# Patient Record
Sex: Male | Born: 1935 | Race: White | Hispanic: No | Marital: Married | Smoking: Former smoker
Health system: Southern US, Community
[De-identification: ages and names within clinical notes are randomized; demographics above are authoritative.]

## PROBLEM LIST (undated history)

## (undated) DIAGNOSIS — E119 Type 2 diabetes mellitus without complications: Secondary | ICD-10-CM

## (undated) DIAGNOSIS — N200 Calculus of kidney: Secondary | ICD-10-CM

## (undated) DIAGNOSIS — J189 Pneumonia, unspecified organism: Secondary | ICD-10-CM

## (undated) DIAGNOSIS — Z950 Presence of cardiac pacemaker: Secondary | ICD-10-CM

## (undated) DIAGNOSIS — I4891 Unspecified atrial fibrillation: Secondary | ICD-10-CM

## (undated) DIAGNOSIS — E785 Hyperlipidemia, unspecified: Secondary | ICD-10-CM

## (undated) DIAGNOSIS — I2510 Atherosclerotic heart disease of native coronary artery without angina pectoris: Secondary | ICD-10-CM

## (undated) HISTORY — DX: Hyperlipidemia, unspecified: E78.5

## (undated) HISTORY — PX: CATARACT EXTRACTION W/ INTRAOCULAR LENS  IMPLANT, BILATERAL: SHX1307

## (undated) HISTORY — PX: APPENDECTOMY: SHX54

## (undated) HISTORY — DX: Atherosclerotic heart disease of native coronary artery without angina pectoris: I25.10

## (undated) HISTORY — PX: INGUINAL HERNIA REPAIR: SUR1180

---

## 1996-10-16 HISTORY — PX: CORONARY ARTERY BYPASS GRAFT: SHX141

## 2010-01-26 ENCOUNTER — Ambulatory Visit: Payer: Self-pay | Admitting: Ophthalmology

## 2010-01-31 ENCOUNTER — Ambulatory Visit: Payer: Self-pay | Admitting: Cardiology

## 2010-02-07 ENCOUNTER — Ambulatory Visit: Payer: Self-pay | Admitting: Ophthalmology

## 2013-01-01 ENCOUNTER — Ambulatory Visit: Payer: Self-pay | Admitting: Cardiovascular Disease

## 2013-01-01 DIAGNOSIS — I4821 Permanent atrial fibrillation: Secondary | ICD-10-CM | POA: Insufficient documentation

## 2013-01-01 DIAGNOSIS — I4891 Unspecified atrial fibrillation: Secondary | ICD-10-CM

## 2013-01-01 DIAGNOSIS — Z7901 Long term (current) use of anticoagulants: Secondary | ICD-10-CM | POA: Insufficient documentation

## 2013-03-06 ENCOUNTER — Ambulatory Visit: Payer: Self-pay | Admitting: Pharmacist Clinician (PhC)/ Clinical Pharmacy Specialist

## 2013-03-07 ENCOUNTER — Ambulatory Visit (INDEPENDENT_AMBULATORY_CARE_PROVIDER_SITE_OTHER): Payer: Medicare Other | Admitting: Pharmacist Clinician (PhC)/ Clinical Pharmacy Specialist

## 2013-03-07 VITALS — BP 140/80 | HR 60

## 2013-03-07 DIAGNOSIS — I4891 Unspecified atrial fibrillation: Secondary | ICD-10-CM

## 2013-03-07 DIAGNOSIS — Z7901 Long term (current) use of anticoagulants: Secondary | ICD-10-CM

## 2013-04-23 ENCOUNTER — Ambulatory Visit (INDEPENDENT_AMBULATORY_CARE_PROVIDER_SITE_OTHER): Payer: Medicare Other | Admitting: Pharmacist Clinician (PhC)/ Clinical Pharmacy Specialist

## 2013-04-23 VITALS — BP 136/76 | HR 72

## 2013-04-23 DIAGNOSIS — Z7901 Long term (current) use of anticoagulants: Secondary | ICD-10-CM

## 2013-04-23 DIAGNOSIS — I4891 Unspecified atrial fibrillation: Secondary | ICD-10-CM

## 2013-04-23 LAB — POCT INR: INR: 2.2

## 2013-05-19 ENCOUNTER — Ambulatory Visit: Payer: Self-pay | Admitting: Ophthalmology

## 2013-05-19 LAB — PROTIME-INR
INR: 2.1
Prothrombin Time: 22.9 secs — ABNORMAL HIGH (ref 11.5–14.7)

## 2013-05-26 ENCOUNTER — Ambulatory Visit: Payer: Self-pay | Admitting: Ophthalmology

## 2013-06-04 ENCOUNTER — Ambulatory Visit
Payer: No Typology Code available for payment source | Admitting: Pharmacist Clinician (PhC)/ Clinical Pharmacy Specialist

## 2013-06-04 ENCOUNTER — Ambulatory Visit (INDEPENDENT_AMBULATORY_CARE_PROVIDER_SITE_OTHER): Payer: Medicare Other | Admitting: Pharmacist Clinician (PhC)/ Clinical Pharmacy Specialist

## 2013-06-04 VITALS — BP 110/60 | HR 56

## 2013-06-04 DIAGNOSIS — Z7901 Long term (current) use of anticoagulants: Secondary | ICD-10-CM

## 2013-06-04 DIAGNOSIS — I4891 Unspecified atrial fibrillation: Secondary | ICD-10-CM

## 2013-06-04 LAB — POCT INR: INR: 2.4

## 2013-07-16 ENCOUNTER — Ambulatory Visit (INDEPENDENT_AMBULATORY_CARE_PROVIDER_SITE_OTHER): Payer: Medicare Other | Admitting: Pharmacist Clinician (PhC)/ Clinical Pharmacy Specialist

## 2013-07-16 VITALS — BP 120/56 | HR 60

## 2013-07-16 DIAGNOSIS — Z7901 Long term (current) use of anticoagulants: Secondary | ICD-10-CM

## 2013-07-16 DIAGNOSIS — I4891 Unspecified atrial fibrillation: Secondary | ICD-10-CM

## 2013-07-16 LAB — POCT INR: INR: 2.2

## 2013-08-28 ENCOUNTER — Ambulatory Visit (INDEPENDENT_AMBULATORY_CARE_PROVIDER_SITE_OTHER): Payer: Medicare Other | Admitting: Pharmacist Clinician (PhC)/ Clinical Pharmacy Specialist

## 2013-08-28 VITALS — BP 124/72 | HR 52

## 2013-08-28 DIAGNOSIS — I4891 Unspecified atrial fibrillation: Secondary | ICD-10-CM

## 2013-08-28 DIAGNOSIS — Z7901 Long term (current) use of anticoagulants: Secondary | ICD-10-CM

## 2013-10-13 ENCOUNTER — Ambulatory Visit (INDEPENDENT_AMBULATORY_CARE_PROVIDER_SITE_OTHER): Payer: Medicare Other | Admitting: Pharmacist Clinician (PhC)/ Clinical Pharmacy Specialist

## 2013-10-13 VITALS — BP 146/64 | HR 60

## 2013-10-13 DIAGNOSIS — Z7901 Long term (current) use of anticoagulants: Secondary | ICD-10-CM

## 2013-10-13 DIAGNOSIS — I4891 Unspecified atrial fibrillation: Secondary | ICD-10-CM

## 2013-10-23 ENCOUNTER — Ambulatory Visit (INDEPENDENT_AMBULATORY_CARE_PROVIDER_SITE_OTHER): Payer: Medicare HMO | Admitting: Cardiovascular Disease

## 2013-10-23 ENCOUNTER — Encounter: Payer: Self-pay | Admitting: Cardiovascular Disease

## 2013-10-23 VITALS — BP 132/80 | HR 53 | Resp 20 | Ht 67.0 in | Wt 195.8 lb

## 2013-10-23 DIAGNOSIS — I2510 Atherosclerotic heart disease of native coronary artery without angina pectoris: Secondary | ICD-10-CM

## 2013-10-23 DIAGNOSIS — I4891 Unspecified atrial fibrillation: Secondary | ICD-10-CM

## 2013-10-23 DIAGNOSIS — E118 Type 2 diabetes mellitus with unspecified complications: Secondary | ICD-10-CM

## 2013-10-23 DIAGNOSIS — E785 Hyperlipidemia, unspecified: Secondary | ICD-10-CM

## 2013-10-23 DIAGNOSIS — I10 Essential (primary) hypertension: Secondary | ICD-10-CM

## 2013-10-23 NOTE — Patient Instructions (Signed)
Dr. Croitoru recommends that you schedule a follow-up appointment in: ONE YEAR   

## 2013-10-24 ENCOUNTER — Encounter: Payer: Self-pay | Admitting: Cardiovascular Disease

## 2013-10-24 DIAGNOSIS — I2510 Atherosclerotic heart disease of native coronary artery without angina pectoris: Secondary | ICD-10-CM | POA: Insufficient documentation

## 2013-10-24 DIAGNOSIS — I10 Essential (primary) hypertension: Secondary | ICD-10-CM | POA: Insufficient documentation

## 2013-10-24 DIAGNOSIS — E1129 Type 2 diabetes mellitus with other diabetic kidney complication: Secondary | ICD-10-CM | POA: Insufficient documentation

## 2013-10-24 DIAGNOSIS — E7800 Pure hypercholesterolemia, unspecified: Secondary | ICD-10-CM | POA: Insufficient documentation

## 2013-10-24 DIAGNOSIS — E118 Type 2 diabetes mellitus with unspecified complications: Secondary | ICD-10-CM | POA: Insufficient documentation

## 2013-10-24 NOTE — Assessment & Plan Note (Signed)
Ventricular rate is quite slow despite the absence of any AV node blocking agents. This suggests that he has fairly advanced AV node disease and it is possible that he will eventually require a permanent pacemaker. This is not necessary as long as he does not have symptoms of fatigue, exertional dyspnea or syncope. Continue lifelong anticoagulations with warfarin or equivalent drug.

## 2013-10-24 NOTE — Progress Notes (Signed)
Patient ID: Steve Lloyd, male   DOB: 19-Dec-1929, 80 y.o.   MRN: [id number]     Reason for office visit Followup CAD, atrial fibrillation  Mr. Delapena is a remarkably active and youthful appearing 80 year old man who returns in followup. He underwent coronary bypass surgery in 1998 (single-vessel LIMA to LAD) and has not had recurrent cardiac problems since that time. He has permanent atrial fibrillation with slow ventricular response and is on warfarin anticoagulation. He has not had any bleeding complications, stroke or other embolic events. He feels great. He plays golf 2-3 times a week and often does not use the golf cart. He denies also shortness of breath or chest pain.   No Known Allergies  Current Outpatient Prescriptions  Medication Sig Dispense Refill  . amLODipine (NORVASC) 5 MG tablet Take 5 mg by mouth daily.      Marland Kitchen aspirin 81 MG tablet Take 81 mg by mouth daily.      Marland Kitchen atorvastatin (LIPITOR) 10 MG tablet Take 10 mg by mouth daily.      . benazepril (LOTENSIN) 10 MG tablet Take 10 mg by mouth daily.      . Cyanocobalamin (VITAMIN B-12 IJ) Inject as directed every 30 (thirty) days.      . Multiple Vitamin (MULTIVITAMIN) tablet Take 1 tablet by mouth daily.      . pioglitazone-metformin (ACTOPLUS MET) 15-500 MG per tablet Take 1 tablet by mouth 2 (two) times daily with a meal.      . warfarin (COUMADIN) 5 MG tablet Take 5 mg by mouth daily. 71m daily except 2.572mM/W/F       No current facility-administered medications for this visit.    No past medical history on file.  No past surgical history on file.  No family history on file.  History   Social History  . Marital Status: Married    Spouse Name: N/A    Number of Children: N/A  . Years of Education: N/A   Occupational History  . Not on file.   Social History Main Topics  . Smoking status: Former Smoker    Quit date: 10/15/1956  . Smokeless tobacco: Not on file  . Alcohol Use: Yes     Comment: occas.  .  Drug Use: No  . Sexual Activity: Not on file   Other Topics Concern  . Not on file   Social History Narrative  . No narrative on file    Review of systems: The patient specifically denies any chest pain at rest or with exertion, dyspnea at rest or with exertion, orthopnea, paroxysmal nocturnal dyspnea, syncope, palpitations, focal neurological deficits, intermittent claudication, lower extremity edema, unexplained weight gain, cough, hemoptysis or wheezing.  The patient also denies abdominal pain, nausea, vomiting, dysphagia, diarrhea, constipation, polyuria, polydipsia, dysuria, hematuria, frequency, urgency, abnormal bleeding or bruising, fever, chills, unexpected weight changes, mood swings, change in skin or hair texture, change in voice quality, auditory or visual problems, allergic reactions or rashes, new musculoskeletal complaints other than usual "aches and pains".   PHYSICAL EXAM BP 132/80  Pulse 53  Resp 20  Ht _0  (1.702 m)  Wt 195 lb 12.8 oz (88.814 kg)  BMI 30.66 kg/m2  General: Alert, oriented x3, no distress Head: no evidence of trauma, PERRL, EOMI, no exophtalmos or lid lag, no myxedema, no xanthelasma; normal ears, nose and oropharynx Neck: normal jugular venous pulsations and no hepatojugular reflux; brisk carotid pulses without delay and no carotid bruits Chest: clear to auscultation, no  signs of consolidation by percussion or palpation, normal fremitus, symmetrical and full respiratory excursions, sternotomy scar Cardiovascular: normal position and quality of the apical impulse, irregular rhythm, normal first and second heart sounds, no murmurs, rubs or gallops Abdomen: no tenderness or distention, no masses by palpation, no abnormal pulsatility or arterial bruits, normal bowel sounds, no hepatosplenomegaly Extremities: no clubbing, cyanosis or edema; 2+ radial, ulnar and brachial pulses bilaterally; 2+ right femoral, posterior tibial and dorsalis pedis pulses; 2+  left femoral, posterior tibial and dorsalis pedis pulses; no subclavian or femoral bruits Neurological: grossly nonfocal   EKG: Atrial fibrillation with slow ventricular response and nonspecific ST-T segment changes  Lipid Panel  Total cholesterol 138, triglycerides 86, HDL 63, LDL 58 Hemoglobin A1c 6%  BMET No results found for this basename: na, k, cl, co2, glucose, bun, creatinine, calcium, gfrnonaa, gfraa     ASSESSMENT AND PLAN Atrial fibrillation, permanent Ventricular rate is quite slow despite the absence of any AV node blocking agents. This suggests that he has fairly advanced AV node disease and it is possible that he will eventually require a permanent pacemaker. This is not necessary as long as he does not have symptoms of fatigue, exertional dyspnea or syncope. Continue lifelong anticoagulations with warfarin or equivalent drug.  CAD s/p CABG LIMA to LAD 1998 He has had no symptoms of coronary disease since his bypass procedure more than 16 years ago. His coronary risk factors are generally well controlled. I have encouraged him to remain physically active as this should give [place] early warning of recurrent cardiac problems.  Essential hypertension Well-controlled  Diabetes mellitus type 2 with complications, controlled    Hyperlipidemia He has excellent lipid parameters on the current statin regimen   Patient Instructions  Dr. Sallyanne Kuster recommends that you schedule a follow-up appointment in: [place].     Orders Placed This Encounter  Procedures  . EKG 12-Lead   Meds ordered this encounter  Medications  . benazepril (LOTENSIN) 10 MG tablet    Sig: Take 10 mg by mouth daily.  Marland Kitchen amLODipine (NORVASC) 5 MG tablet    Sig: Take 5 mg by mouth daily.  Marland Kitchen atorvastatin (LIPITOR) 10 MG tablet    Sig: Take 10 mg by mouth daily.  Marland Kitchen warfarin (COUMADIN) 5 MG tablet    Sig: Take 5 mg by mouth daily. 26m daily except 2.532mM/W/F  . aspirin 81 MG tablet    Sig: Take 81  mg by mouth daily.  . Multiple Vitamin (MULTIVITAMIN) tablet    Sig: Take 1 tablet by mouth daily.  . pioglitazone-metformin (ACTOPLUS MET) 15-500 MG per tablet    Sig: Take 1 tablet by mouth 2 (two) times daily with a meal.  . Cyanocobalamin (VITAMIN B-12 IJ)    Sig: Inject as directed every 30 (thirty) days.    CRHolli HumblesMD, FALake [phone] ([phone]

## 2013-10-24 NOTE — Assessment & Plan Note (Signed)
Well controlled 

## 2013-10-24 NOTE — Assessment & Plan Note (Signed)
He has excellent lipid parameters on the current statin regimen

## 2013-10-24 NOTE — Assessment & Plan Note (Signed)
He has had no symptoms of coronary disease since his bypass procedure more than 16 years ago. His coronary risk factors are generally well controlled. I have encouraged him to remain physically active as this should give [place] early warning of recurrent cardiac problems.

## 2013-11-24 ENCOUNTER — Ambulatory Visit (INDEPENDENT_AMBULATORY_CARE_PROVIDER_SITE_OTHER): Payer: Medicare HMO | Admitting: Pharmacist Clinician (PhC)/ Clinical Pharmacy Specialist

## 2013-11-24 VITALS — BP 122/56 | HR 52

## 2013-11-24 DIAGNOSIS — Z7901 Long term (current) use of anticoagulants: Secondary | ICD-10-CM

## 2013-11-24 DIAGNOSIS — I4891 Unspecified atrial fibrillation: Secondary | ICD-10-CM

## 2013-11-24 LAB — POCT INR: INR: 2.5

## 2014-01-05 ENCOUNTER — Ambulatory Visit (INDEPENDENT_AMBULATORY_CARE_PROVIDER_SITE_OTHER): Payer: Medicare HMO | Admitting: Pharmacist Clinician (PhC)/ Clinical Pharmacy Specialist

## 2014-01-05 DIAGNOSIS — I4891 Unspecified atrial fibrillation: Secondary | ICD-10-CM

## 2014-01-05 DIAGNOSIS — Z7901 Long term (current) use of anticoagulants: Secondary | ICD-10-CM

## 2014-01-05 LAB — POCT INR: INR: 2.3

## 2014-02-12 ENCOUNTER — Other Ambulatory Visit: Payer: Self-pay

## 2014-02-12 MED ORDER — WARFARIN SODIUM 5 MG PO TABS: ORAL_TABLET | ORAL | Status: DC

## 2014-02-12 NOTE — Telephone Encounter (Signed)
Rx was sent to pharmacy electronically. 

## 2014-02-16 ENCOUNTER — Ambulatory Visit (INDEPENDENT_AMBULATORY_CARE_PROVIDER_SITE_OTHER): Payer: Medicare HMO | Admitting: Pharmacist Clinician (PhC)/ Clinical Pharmacy Specialist

## 2014-02-16 DIAGNOSIS — Z7901 Long term (current) use of anticoagulants: Secondary | ICD-10-CM

## 2014-02-16 DIAGNOSIS — I4891 Unspecified atrial fibrillation: Secondary | ICD-10-CM

## 2014-02-16 LAB — POCT INR: INR: 2.1

## 2014-03-13 ENCOUNTER — Telehealth: Payer: Self-pay | Admitting: Pharmacist Clinician (PhC)/ Clinical Pharmacy Specialist

## 2014-03-13 NOTE — Telephone Encounter (Signed)
Pt called, LMOM to return call  Returned call, pt stated was golfing on Tuesday, pulled muscle in arm with swing.  Next day arm was badly bruised, from shoulder down toward wrist.  He apparently applied heat within the first 24hrs.    Arm is deep purple bruise, all around and from shoulder to wrist.  Pt states can move fingers, wrist and elbow without pain or limitation, no tightness, numbness or tingling.  No pain.  Advised pt to continue to monitor and go to ER or UC if any problems such as pain, tightness, numbness or tingling develop.  Otherwise apply heat now for 5-10 minutes once to twice daily to help break up blood in tissue.    Pt agreed to plan and will seek help over weekend if needed.

## 2014-03-30 ENCOUNTER — Ambulatory Visit (INDEPENDENT_AMBULATORY_CARE_PROVIDER_SITE_OTHER): Payer: Commercial Managed Care - HMO | Admitting: Pharmacist Clinician (PhC)/ Clinical Pharmacy Specialist

## 2014-03-30 DIAGNOSIS — I4891 Unspecified atrial fibrillation: Secondary | ICD-10-CM | POA: Diagnosis not present

## 2014-03-30 DIAGNOSIS — Z7901 Long term (current) use of anticoagulants: Secondary | ICD-10-CM

## 2014-03-30 LAB — POCT INR: INR: 1.8

## 2014-05-11 ENCOUNTER — Ambulatory Visit (INDEPENDENT_AMBULATORY_CARE_PROVIDER_SITE_OTHER): Payer: Medicare HMO | Admitting: Pharmacist Clinician (PhC)/ Clinical Pharmacy Specialist

## 2014-05-11 DIAGNOSIS — I4891 Unspecified atrial fibrillation: Secondary | ICD-10-CM

## 2014-05-11 DIAGNOSIS — Z7901 Long term (current) use of anticoagulants: Secondary | ICD-10-CM

## 2014-05-11 LAB — POCT INR: INR: 2.4

## 2014-06-24 ENCOUNTER — Ambulatory Visit (INDEPENDENT_AMBULATORY_CARE_PROVIDER_SITE_OTHER): Payer: Medicare HMO | Admitting: Pharmacist Clinician (PhC)/ Clinical Pharmacy Specialist

## 2014-06-24 DIAGNOSIS — Z7901 Long term (current) use of anticoagulants: Secondary | ICD-10-CM | POA: Diagnosis not present

## 2014-06-24 DIAGNOSIS — I4891 Unspecified atrial fibrillation: Secondary | ICD-10-CM | POA: Diagnosis not present

## 2014-06-24 LAB — POCT INR: INR: 1.5

## 2014-07-08 ENCOUNTER — Ambulatory Visit (INDEPENDENT_AMBULATORY_CARE_PROVIDER_SITE_OTHER): Payer: Commercial Managed Care - HMO | Admitting: Pharmacist Clinician (PhC)/ Clinical Pharmacy Specialist

## 2014-07-08 DIAGNOSIS — I4891 Unspecified atrial fibrillation: Secondary | ICD-10-CM

## 2014-07-08 DIAGNOSIS — Z7901 Long term (current) use of anticoagulants: Secondary | ICD-10-CM

## 2014-07-08 LAB — POCT INR: INR: 2.8

## 2014-08-05 ENCOUNTER — Ambulatory Visit (INDEPENDENT_AMBULATORY_CARE_PROVIDER_SITE_OTHER): Payer: Commercial Managed Care - HMO | Admitting: Pharmacist Clinician (PhC)/ Clinical Pharmacy Specialist

## 2014-08-05 DIAGNOSIS — Z7901 Long term (current) use of anticoagulants: Secondary | ICD-10-CM

## 2014-08-05 DIAGNOSIS — I4891 Unspecified atrial fibrillation: Secondary | ICD-10-CM

## 2014-08-05 LAB — POCT INR
INR: 2.7
INR: 2.7

## 2014-09-16 ENCOUNTER — Ambulatory Visit (INDEPENDENT_AMBULATORY_CARE_PROVIDER_SITE_OTHER): Payer: Commercial Managed Care - HMO | Admitting: Pharmacist Clinician (PhC)/ Clinical Pharmacy Specialist

## 2014-09-16 DIAGNOSIS — I4891 Unspecified atrial fibrillation: Secondary | ICD-10-CM

## 2014-09-16 DIAGNOSIS — Z7901 Long term (current) use of anticoagulants: Secondary | ICD-10-CM

## 2014-09-16 LAB — POCT INR: INR: 3.6

## 2014-09-26 ENCOUNTER — Other Ambulatory Visit: Payer: Self-pay | Admitting: Pharmacist Clinician (PhC)/ Clinical Pharmacy Specialist

## 2014-10-07 ENCOUNTER — Ambulatory Visit (INDEPENDENT_AMBULATORY_CARE_PROVIDER_SITE_OTHER): Payer: Commercial Managed Care - HMO | Admitting: Pharmacist Clinician (PhC)/ Clinical Pharmacy Specialist

## 2014-10-07 DIAGNOSIS — I4891 Unspecified atrial fibrillation: Secondary | ICD-10-CM

## 2014-10-07 DIAGNOSIS — Z7901 Long term (current) use of anticoagulants: Secondary | ICD-10-CM

## 2014-10-07 LAB — POCT INR: INR: 3

## 2014-10-26 ENCOUNTER — Encounter: Payer: Self-pay | Admitting: Cardiovascular Disease

## 2014-10-26 ENCOUNTER — Ambulatory Visit (INDEPENDENT_AMBULATORY_CARE_PROVIDER_SITE_OTHER): Payer: Commercial Managed Care - HMO | Admitting: Pharmacist Clinician (PhC)/ Clinical Pharmacy Specialist

## 2014-10-26 ENCOUNTER — Ambulatory Visit (INDEPENDENT_AMBULATORY_CARE_PROVIDER_SITE_OTHER): Payer: Commercial Managed Care - HMO | Admitting: Cardiovascular Disease

## 2014-10-26 VITALS — BP 128/70 | HR 49 | Resp 16 | Ht 67.0 in | Wt 185.1 lb

## 2014-10-26 DIAGNOSIS — I4891 Unspecified atrial fibrillation: Secondary | ICD-10-CM

## 2014-10-26 DIAGNOSIS — I2510 Atherosclerotic heart disease of native coronary artery without angina pectoris: Secondary | ICD-10-CM

## 2014-10-26 DIAGNOSIS — Z7901 Long term (current) use of anticoagulants: Secondary | ICD-10-CM

## 2014-10-26 LAB — POCT INR: INR: 2.6

## 2014-10-26 NOTE — Patient Instructions (Signed)
Your physician wants you to follow-up in: 1 Year. You will receive a reminder letter in the mail two months in advance. If you don't receive a letter, please call our office to schedule the follow-up appointment.  

## 2014-10-27 ENCOUNTER — Encounter: Payer: Self-pay | Admitting: Cardiovascular Disease

## 2014-10-27 NOTE — Progress Notes (Signed)
Patient ID: Steve Lloyd, male   DOB: 1930-09-18, 81 y.o.   MRN: [id number]      Reason for office visit CAD, atrial fibrillation with slow ventricular response   Steve Lloyd remains a remarkably active and youthful appearing 81 year old man who returns in followup. He underwent coronary bypass surgery in 1998 (single-vessel LIMA to LAD) and has not had recurrent cardiac problems since that time. He has permanent atrial fibrillation with slow ventricular response and is on warfarin anticoagulation. He has not had any bleeding complications, stroke or other embolic events. He feels great. He plays golf 2-3 times a week and often does not use the golf cart. He denies also shortness of breath or chest pain.  He has lost 15 pounds since last year and has moved out of the "obese" category. He is now just overweight. He has diabetes mellitus on oral indications. He takes a statin for hyperlipidemia.  He denies any problems with dizziness, lightheadedness, syncope or fatigue despite a very slow heart rate. No Known Allergies  Current Outpatient Prescriptions  Medication Sig Dispense Refill  . amLODipine (NORVASC) 5 MG tablet Take 5 mg by mouth daily.    Marland Kitchen aspirin 81 MG tablet Take 81 mg by mouth daily.    Marland Kitchen atorvastatin (LIPITOR) 10 MG tablet Take 10 mg by mouth daily.    . benazepril (LOTENSIN) 10 MG tablet Take 10 mg by mouth daily.    . Cyanocobalamin (VITAMIN B-12 IJ) Inject 1 mg as directed every 30 (thirty) days.     . Multiple Vitamin (MULTIVITAMIN) tablet Take 1 tablet by mouth daily.    . pioglitazone-metformin (ACTOPLUS MET) 15-500 MG per tablet Take 1 tablet by mouth 2 (two) times daily with a meal.    . warfarin (COUMADIN) 5 MG tablet TAKE 1 TABLET BY MOUTH DAILY OR AS DIRECTED 90 tablet 1   No current facility-administered medications for this visit.    Past Medical History  Diagnosis Date  . Coronary artery disease   . Atrial fibrillation   . Hyperlipidemia   . Diabetes  mellitus without complication     Past Surgical History  Procedure Laterality Date  . Coronary artery bypass graft      History reviewed. No pertinent family history.  History   Social History  . Marital Status: Married    Spouse Name: N/A    Number of Children: N/A  . Years of Education: N/A   Occupational History  . Not on file.   Social History Main Topics  . Smoking status: Former Smoker    Quit date: 10/15/1956  . Smokeless tobacco: Not on file  . Alcohol Use: Yes     Comment: occas.  . Drug Use: No  . Sexual Activity: Not on file   Other Topics Concern  . Not on file   Social History Narrative    Review of systems: The patient specifically denies any chest pain at rest or with exertion, dyspnea at rest or with exertion, orthopnea, paroxysmal nocturnal dyspnea, syncope, palpitations, focal neurological deficits, intermittent claudication, lower extremity edema, unexplained weight gain, cough, hemoptysis or wheezing.  The patient also denies abdominal pain, nausea, vomiting, dysphagia, diarrhea, constipation, polyuria, polydipsia, dysuria, hematuria, frequency, urgency, abnormal bleeding or bruising, fever, chills, unexpected weight changes, mood swings, change in skin or hair texture, change in voice quality, auditory or visual problems, allergic reactions or rashes, new musculoskeletal complaints other than usual "aches and pains".   PHYSICAL EXAM BP 128/70 mmHg  Pulse  49  Resp 16  Ht 5' 7"  (1.702 m)  Wt 185 lb 1.6 oz (83.961 kg)  BMI 28.98 kg/m2  General: Alert, oriented x3, no distress Head: no evidence of trauma, PERRL, EOMI, no exophtalmos or lid lag, no myxedema, no xanthelasma; normal ears, nose and oropharynx Neck: normal jugular venous pulsations and no hepatojugular reflux; brisk carotid pulses without delay and no carotid bruits Chest: clear to auscultation, no signs of consolidation by percussion or palpation, normal fremitus, symmetrical and full  respiratory excursions Cardiovascular: normal position and quality of the apical impulse, slow irregular rhythm, normal first and second heart sounds, no murmurs, rubs or gallops Abdomen: no tenderness or distention, no masses by palpation, no abnormal pulsatility or arterial bruits, normal bowel sounds, no hepatosplenomegaly Extremities: no clubbing, cyanosis or edema; 2+ radial, ulnar and brachial pulses bilaterally; 2+ right femoral, posterior tibial and dorsalis pedis pulses; 2+ left femoral, posterior tibial and dorsalis pedis pulses; no subclavian or femoral bruits Neurological: grossly nonfocal   EKG:  Atrial fibrillation with slow ventricular response, minor T-wave inversion in leads 1 and aVL minimally changed from previous tracing  Lipid Panel  Total cholesterol 138, triglycerides 86, HDL 63, LDL 58 Hemoglobin A1c 6%  ASSESSMENT AND PLAN Atrial fibrillation, permanent Ventricular rate is quite slow despite the absence of any AV node blocking agents. Suspect he will eventually require a permanent pacemaker. This is not necessary as long as he does not have symptoms of fatigue, exertional dyspnea or syncope. Continue lifelong anticoagulations with warfarin or equivalent drug.  CAD s/p CABG LIMA to LAD 1998 He has had no symptoms of coronary disease since his bypass procedure more than 16 years ago. His coronary risk factors are generally well controlled. I have encouraged him to remain physically active as this should give [place] early warning of recurrent cardiac problems.  Essential hypertension Well-controlled  Diabetes mellitus type 2 with complications, controlled  Hyperlipidemia He has excellent lipid parameters on the current statin regimen Orders Placed This Encounter  Procedures  . EKG 12-Lead   Steve Lloyd  Steve Klein, MD, [facility] [phone] office [phone] pager

## 2014-12-07 ENCOUNTER — Ambulatory Visit: Payer: Medicare HMO | Admitting: Pharmacist Clinician (PhC)/ Clinical Pharmacy Specialist

## 2014-12-07 ENCOUNTER — Ambulatory Visit (INDEPENDENT_AMBULATORY_CARE_PROVIDER_SITE_OTHER): Payer: Commercial Managed Care - HMO | Admitting: Pharmacist Clinician (PhC)/ Clinical Pharmacy Specialist

## 2014-12-07 DIAGNOSIS — Z7901 Long term (current) use of anticoagulants: Secondary | ICD-10-CM

## 2014-12-07 DIAGNOSIS — I4891 Unspecified atrial fibrillation: Secondary | ICD-10-CM

## 2014-12-07 LAB — POCT INR: INR: 2.2

## 2015-01-18 ENCOUNTER — Ambulatory Visit (INDEPENDENT_AMBULATORY_CARE_PROVIDER_SITE_OTHER): Payer: Commercial Managed Care - HMO | Admitting: Pharmacist Clinician (PhC)/ Clinical Pharmacy Specialist

## 2015-01-18 DIAGNOSIS — I4891 Unspecified atrial fibrillation: Secondary | ICD-10-CM

## 2015-01-18 DIAGNOSIS — Z7901 Long term (current) use of anticoagulants: Secondary | ICD-10-CM

## 2015-01-18 LAB — POCT INR: INR: 2.1

## 2015-02-05 NOTE — Op Note (Signed)
PATIENT NAME:  Steve Lloyd, Steve Lloyd MR#:  [id number] DATE OF BIRTH:  August 20, 1930  DATE OF PROCEDURE:  05/26/2013  PREOPERATIVE DIAGNOSIS: Cataract, left eye.   POSTOPERATIVE DIAGNOSIS: Cataract, left eye.  PROCEDURE PERFORMED: Extracapsular cataract extraction using phacoemulsification with placement of Alcon [id number], 19.0-diopter posterior chamber lens, serial number [id number].   SURGEON: Loura Back. Rin Gorton, M.D.   ANESTHESIA: 4% lidocaine and 0.75% Marcaine, a 50-50 mixture with 10 units/mL of HyoMax added given as a peribulbar.   ANESTHESIOLOGIST: Dr. Boston Service.   COMPLICATIONS: None.   ESTIMATED BLOOD LOSS: Less than 1 mL.   DESCRIPTION OF PROCEDURE: The patient was brought to the operating room and given a peribulbar block.  The patient was then prepped and draped in the usual fashion.  The vertical rectus muscles were imbricated using 5-0 silk sutures.  These sutures were then clamped to the sterile drapes as bridle sutures.  A limbal peritomy was performed extending two clock hours and hemostasis was obtained with cautery.  A partial thickness scleral groove was made at the surgical limbus and dissected anteriorly in a lamellar dissection using an Alcon crescent knife.  The anterior chamber was entered supero-temporally with a Superblade and through the lamellar dissection with a 2.6 mm keratome.  DisCoVisc was used to replace the aqueous and a continuous tear capsulorrhexis was carried out.  Hydrodissection and hydrodelineation were carried out with balanced salt and a 27 gauge canula.  The nucleus was rotated to confirm the effectiveness of the hydrodissection.  Phacoemulsification was carried out using a divide-and-conquer technique.  Total ultrasound time was 1 minutes and 43.4 seconds with an average power of 23.3%. CDE of 41.50.  Irrigation/aspiration was used to remove the residual cortex.  DisCoVisc was used to inflate the capsule and the internal incision was enlarged to 3  mm with the crescent knife.  The intraocular lens was folded and inserted into the capsular bag using the AcrySert delivery system.  Irrigation/aspiration was used to remove the residual DisCoVisc.  Miostat was injected into the anterior chamber through the paracentesis track to inflate the anterior chamber and induce miosis.  A tenth of a mL of cefuroxime containing 1 mg of the drug was injected via the paracentesis track. The wound was checked for leaks and none were found. The conjunctiva was closed with cautery and the bridle sutures were removed.  Two drops of 0.3% Vigamox were placed on the eye.   An eye shield was placed on the eye.  The patient was discharged to the recovery room in good condition.   ____________________________ Loura Back Steve Seeling, MD sad:aw D: 05/26/2013 14:06:20 ET T: 05/26/2013 14:58:47 ET JOB#: [id number]  cc: Remo Lipps A. Yatzil Clippinger, MD, <Dictator> Martie Lee MD ELECTRONICALLY SIGNED 06/02/2013 13:52

## 2015-03-01 ENCOUNTER — Ambulatory Visit (INDEPENDENT_AMBULATORY_CARE_PROVIDER_SITE_OTHER): Payer: Commercial Managed Care - HMO | Admitting: Pharmacist Clinician (PhC)/ Clinical Pharmacy Specialist

## 2015-03-01 DIAGNOSIS — Z7901 Long term (current) use of anticoagulants: Secondary | ICD-10-CM | POA: Diagnosis not present

## 2015-03-01 DIAGNOSIS — I4891 Unspecified atrial fibrillation: Secondary | ICD-10-CM

## 2015-03-01 LAB — POCT INR: INR: 2

## 2015-04-13 ENCOUNTER — Ambulatory Visit (INDEPENDENT_AMBULATORY_CARE_PROVIDER_SITE_OTHER): Payer: Commercial Managed Care - HMO | Admitting: Pharmacist Clinician (PhC)/ Clinical Pharmacy Specialist

## 2015-04-13 DIAGNOSIS — I4891 Unspecified atrial fibrillation: Secondary | ICD-10-CM | POA: Diagnosis not present

## 2015-04-13 DIAGNOSIS — Z7901 Long term (current) use of anticoagulants: Secondary | ICD-10-CM | POA: Diagnosis not present

## 2015-04-13 LAB — POCT INR: INR: 2.3

## 2015-04-14 ENCOUNTER — Other Ambulatory Visit: Payer: Self-pay | Admitting: Pharmacist Clinician (PhC)/ Clinical Pharmacy Specialist

## 2015-05-21 ENCOUNTER — Encounter: Payer: Self-pay | Admitting: Cardiovascular Disease

## 2015-05-24 ENCOUNTER — Ambulatory Visit (INDEPENDENT_AMBULATORY_CARE_PROVIDER_SITE_OTHER): Payer: Commercial Managed Care - HMO | Admitting: Pharmacist Clinician (PhC)/ Clinical Pharmacy Specialist

## 2015-05-24 DIAGNOSIS — Z7901 Long term (current) use of anticoagulants: Secondary | ICD-10-CM | POA: Diagnosis not present

## 2015-05-24 DIAGNOSIS — I4891 Unspecified atrial fibrillation: Secondary | ICD-10-CM

## 2015-05-24 LAB — POCT INR: INR: 2.6

## 2015-06-18 ENCOUNTER — Encounter: Payer: Self-pay | Admitting: Cardiovascular Disease

## 2015-07-05 ENCOUNTER — Ambulatory Visit (INDEPENDENT_AMBULATORY_CARE_PROVIDER_SITE_OTHER): Payer: Commercial Managed Care - HMO | Admitting: Pharmacist Clinician (PhC)/ Clinical Pharmacy Specialist

## 2015-07-05 DIAGNOSIS — Z7901 Long term (current) use of anticoagulants: Secondary | ICD-10-CM

## 2015-07-05 DIAGNOSIS — I4891 Unspecified atrial fibrillation: Secondary | ICD-10-CM | POA: Diagnosis not present

## 2015-07-05 LAB — POCT INR: INR: 2.4

## 2015-07-05 MED ORDER — WARFARIN SODIUM 5 MG PO TABS: ORAL_TABLET | ORAL | Status: DC

## 2015-08-18 ENCOUNTER — Ambulatory Visit (INDEPENDENT_AMBULATORY_CARE_PROVIDER_SITE_OTHER): Payer: Commercial Managed Care - HMO | Admitting: Pharmacist Clinician (PhC)/ Clinical Pharmacy Specialist

## 2015-08-18 VITALS — BP 140/66

## 2015-08-18 DIAGNOSIS — I4891 Unspecified atrial fibrillation: Secondary | ICD-10-CM

## 2015-08-18 DIAGNOSIS — Z7901 Long term (current) use of anticoagulants: Secondary | ICD-10-CM | POA: Diagnosis not present

## 2015-08-18 LAB — POCT INR: INR: 2.9

## 2015-09-29 ENCOUNTER — Ambulatory Visit (INDEPENDENT_AMBULATORY_CARE_PROVIDER_SITE_OTHER): Payer: Commercial Managed Care - HMO | Admitting: Pharmacist Clinician (PhC)/ Clinical Pharmacy Specialist

## 2015-09-29 ENCOUNTER — Ambulatory Visit: Payer: Commercial Managed Care - HMO | Admitting: Pharmacist Clinician (PhC)/ Clinical Pharmacy Specialist

## 2015-09-29 DIAGNOSIS — I4891 Unspecified atrial fibrillation: Secondary | ICD-10-CM

## 2015-09-29 DIAGNOSIS — Z7901 Long term (current) use of anticoagulants: Secondary | ICD-10-CM

## 2015-09-29 LAB — POCT INR: INR: 2

## 2015-11-08 ENCOUNTER — Encounter: Payer: Self-pay | Admitting: Cardiovascular Disease

## 2015-11-08 ENCOUNTER — Ambulatory Visit (INDEPENDENT_AMBULATORY_CARE_PROVIDER_SITE_OTHER): Payer: PPO | Admitting: Cardiovascular Disease

## 2015-11-08 ENCOUNTER — Ambulatory Visit (INDEPENDENT_AMBULATORY_CARE_PROVIDER_SITE_OTHER): Payer: PPO | Admitting: Pharmacist Clinician (PhC)/ Clinical Pharmacy Specialist

## 2015-11-08 VITALS — BP 148/68 | HR 55 | Ht 67.0 in | Wt 188.0 lb

## 2015-11-08 DIAGNOSIS — Z7901 Long term (current) use of anticoagulants: Secondary | ICD-10-CM

## 2015-11-08 DIAGNOSIS — I4891 Unspecified atrial fibrillation: Secondary | ICD-10-CM | POA: Diagnosis not present

## 2015-11-08 DIAGNOSIS — E785 Hyperlipidemia, unspecified: Secondary | ICD-10-CM

## 2015-11-08 DIAGNOSIS — E118 Type 2 diabetes mellitus with unspecified complications: Secondary | ICD-10-CM

## 2015-11-08 DIAGNOSIS — I2510 Atherosclerotic heart disease of native coronary artery without angina pectoris: Secondary | ICD-10-CM | POA: Diagnosis not present

## 2015-11-08 DIAGNOSIS — I10 Essential (primary) hypertension: Secondary | ICD-10-CM

## 2015-11-08 LAB — POCT INR: INR: 2.9

## 2015-11-08 NOTE — Progress Notes (Signed)
Patient ID: Steve Lloyd, male   DOB: [birth year], 82 y.o.   MRN: [id number]    Cardiology Office Note    Date:  11/09/2015   ID:  Steve Lloyd, DOB 18-Aug-1930, MRN [id number]  PCP:  Steve Pink, MD  Cardiologist:   Steve Klein, MD   Chief Complaint  Patient presents with  . Annual Exam    AFIB//pt states no Sx.  CAD s/p CABG  History of Present Illness:  Steve Lloyd is a 82 y.o. male with CAD s/p coronary bypass surgery in 1998 (single-vessel LIMA to LAD) and has not had recurrent cardiac problems since that time. He has permanent atrial fibrillation with slow ventricular response and is on warfarin anticoagulation. He has not had any bleeding complications, stroke or other embolic events. He feels great. He still plays golf 2-3 times a week and often does not use the golf cart. He denies also shortness of breath or chest pain.  He is overweight, has diabetes mellitus on oral medications. He takes a statin for hyperlipidemia.  He denies fatigue, Dizziness or syncope despite a persistently slow heart.   Past Medical History  Diagnosis Date  . Coronary artery disease   . Atrial fibrillation ([place])   . Hyperlipidemia   . Diabetes mellitus without complication [facility])     Past Surgical History  Procedure Laterality Date  . Coronary artery bypass graft      Outpatient Prescriptions Prior to Visit  Medication Sig Dispense Refill  . amLODipine (NORVASC) 5 MG tablet Take 5 mg by mouth daily.    Marland Kitchen aspirin 81 MG tablet Take 81 mg by mouth daily.    Marland Kitchen atorvastatin (LIPITOR) 10 MG tablet Take 10 mg by mouth daily.    . benazepril (LOTENSIN) 10 MG tablet Take 10 mg by mouth daily.    . Cyanocobalamin (VITAMIN B-12 IJ) Inject 1 mg as directed every 30 (thirty) days.     . Multiple Vitamin (MULTIVITAMIN) tablet Take 1 tablet by mouth daily.    . pioglitazone-metformin (ACTOPLUS MET) 15-500 MG per tablet Take 1 tablet by mouth 2 (two) times daily with a meal.    . warfarin  (COUMADIN) 5 MG tablet TAKE 1 TABLET BY MOUTH DAILY OR AS DIRECTED 90 tablet 1   No facility-administered medications prior to visit.     Allergies:   Review of patient's allergies indicates no known allergies.   Social History   Social History  . Marital Status: Married    Spouse Name: N/A  . Number of Children: N/A  . Years of Education: N/A   Social History Main Topics  . Smoking status: Former Smoker    Quit date: 10/15/1956  . Smokeless tobacco: None  . Alcohol Use: Yes     Comment: occas.  . Drug Use: No  . Sexual Activity: Not Asked   Other Topics Concern  . None   Social History Narrative     ROS:   Please see the history of present illness.    ROS All other systems reviewed and are negative.   PHYSICAL EXAM:   VS:  BP 148/68 mmHg  Pulse 55  Ht 5' 7"  (1.702 m)  Wt 188 lb (85.276 kg)  BMI 29.44 kg/m2   GEN: Well nourished, well developed, in no acute distress HEENT: normal Neck: no JVD, carotid bruits, or masses Cardiac: irregular, no murmurs, rubs, or gallops,no edema  Respiratory:  clear to auscultation bilaterally, normal work of breathing GI: soft, nontender, nondistended, +  BS MS: no deformity or atrophy Skin: warm and dry, no rash Neuro:  Alert and Oriented x 3, Strength and sensation are intact Psych: euthymic mood, full affect  Wt Readings from Last 3 Encounters:  11/08/15 188 lb (85.276 kg)  10/26/14 185 lb 1.6 oz (83.961 kg)  10/23/13 195 lb 12.8 oz (88.814 kg)      Studies/Labs Reviewed:   EKG:  EKG is ordered today.  The ekg ordered today demonstrates Atrial fibrillation with slow ventricular response  Recent Labs: Total cholesterol 133, triglycerides91, HDL 63, LDL 52,  hemoglobin A1c 5.7% Hemoglobin 13.2, normal LFTs, creatinine 1.1   ASSESSMENT:    1. Coronary artery disease involving native coronary artery of native heart without angina pectoris   2. Atrial fibrillation, unspecified type ([place])   3. Essential hypertension    4. Hyperlipidemia   5. Long term (current) use of anticoagulants   6. Type 2 diabetes mellitus with complication, without long-term current use of insulin ([facility])      PLAN:  In order of problems listed above:  1. CAD s/p CABG: Asymptomatic, angina free despite  Active lifestyle 2. Permanent AFib: CHADSVasc 6. Spontaneously controlled ventricurar rate Suggests AV node conduction abnormality, at this point no indications for Pacemaker 3. HTN: controlled 4. HLP: excellent parameterson statin 5. Warfarin. No bleeding complications. No embolic complications. 6. DM: Excellent glycemic control    Medication Adjustments/Labs and Tests Ordered: Current medicines are reviewed at length with the patient today.  Concerns regarding medicines are outlined above.  Medication changes, Labs and Tests ordered today are listed in the Patient Instructions below. Patient Instructions  Dr Steve Lloyd has made no changes today in your current medications or treatment plan.  Your physician recommends that you schedule a follow-up appointment in 1 year. You will receive a reminder letter in the mail two months in advance. If you don't receive a letter, please call our office to schedule the follow-up appointment.  If you need a refill on your cardiac medications before your next appointment, please call your pharmacy.      Steve Spray, MD  11/09/2015 2:20 PM    [facility] [place], [place], [place]  [address] Phone: [phone]; Fax: [phone]

## 2015-11-08 NOTE — Patient Instructions (Signed)
Dr Croitoru has made no changes today in your current medications or treatment plan.  Your physician recommends that you schedule a follow-up appointment in 1 year. You will receive a reminder letter in the mail two months in advance. If you don't receive a letter, please call our office to schedule the follow-up appointment.  If you need a refill on your cardiac medications before your next appointment, please call your pharmacy. 

## 2015-12-24 ENCOUNTER — Ambulatory Visit (INDEPENDENT_AMBULATORY_CARE_PROVIDER_SITE_OTHER): Payer: PPO | Admitting: Pharmacist Clinician (PhC)/ Clinical Pharmacy Specialist

## 2015-12-24 DIAGNOSIS — I4891 Unspecified atrial fibrillation: Secondary | ICD-10-CM | POA: Diagnosis not present

## 2015-12-24 DIAGNOSIS — Z7901 Long term (current) use of anticoagulants: Secondary | ICD-10-CM

## 2015-12-24 LAB — POCT INR: INR: 2.5

## 2015-12-24 MED ORDER — WARFARIN SODIUM 5 MG PO TABS: ORAL_TABLET | ORAL | Status: DC

## 2015-12-30 ENCOUNTER — Other Ambulatory Visit: Payer: Self-pay | Admitting: Cardiovascular Disease

## 2016-02-04 ENCOUNTER — Ambulatory Visit (INDEPENDENT_AMBULATORY_CARE_PROVIDER_SITE_OTHER): Payer: PPO | Admitting: Pharmacist Clinician (PhC)/ Clinical Pharmacy Specialist

## 2016-02-04 DIAGNOSIS — I4891 Unspecified atrial fibrillation: Secondary | ICD-10-CM | POA: Diagnosis not present

## 2016-02-04 DIAGNOSIS — Z7901 Long term (current) use of anticoagulants: Secondary | ICD-10-CM | POA: Diagnosis not present

## 2016-02-04 LAB — POCT INR: INR: 3.2

## 2016-03-17 ENCOUNTER — Ambulatory Visit (INDEPENDENT_AMBULATORY_CARE_PROVIDER_SITE_OTHER): Payer: PPO | Admitting: Pharmacist

## 2016-03-17 DIAGNOSIS — Z7901 Long term (current) use of anticoagulants: Secondary | ICD-10-CM

## 2016-03-17 DIAGNOSIS — I4891 Unspecified atrial fibrillation: Secondary | ICD-10-CM

## 2016-03-17 LAB — POCT INR: INR: 2.3

## 2016-04-04 DIAGNOSIS — N402 Nodular prostate without lower urinary tract symptoms: Secondary | ICD-10-CM | POA: Diagnosis not present

## 2016-04-05 DIAGNOSIS — Z125 Encounter for screening for malignant neoplasm of prostate: Secondary | ICD-10-CM | POA: Diagnosis not present

## 2016-04-05 DIAGNOSIS — E119 Type 2 diabetes mellitus without complications: Secondary | ICD-10-CM | POA: Diagnosis not present

## 2016-04-05 DIAGNOSIS — I10 Essential (primary) hypertension: Secondary | ICD-10-CM | POA: Diagnosis not present

## 2016-04-05 DIAGNOSIS — E785 Hyperlipidemia, unspecified: Secondary | ICD-10-CM | POA: Diagnosis not present

## 2016-04-11 DIAGNOSIS — N402 Nodular prostate without lower urinary tract symptoms: Secondary | ICD-10-CM | POA: Diagnosis not present

## 2016-04-11 DIAGNOSIS — R972 Elevated prostate specific antigen [PSA]: Secondary | ICD-10-CM | POA: Diagnosis not present

## 2016-04-13 DIAGNOSIS — Z0000 Encounter for general adult medical examination without abnormal findings: Secondary | ICD-10-CM | POA: Diagnosis not present

## 2016-04-13 DIAGNOSIS — E119 Type 2 diabetes mellitus without complications: Secondary | ICD-10-CM | POA: Diagnosis not present

## 2016-04-13 DIAGNOSIS — R001 Bradycardia, unspecified: Secondary | ICD-10-CM | POA: Diagnosis not present

## 2016-04-28 ENCOUNTER — Ambulatory Visit (INDEPENDENT_AMBULATORY_CARE_PROVIDER_SITE_OTHER): Payer: PPO | Admitting: Pharmacist Clinician (PhC)/ Clinical Pharmacy Specialist

## 2016-04-28 DIAGNOSIS — Z7901 Long term (current) use of anticoagulants: Secondary | ICD-10-CM | POA: Diagnosis not present

## 2016-04-28 DIAGNOSIS — I4891 Unspecified atrial fibrillation: Secondary | ICD-10-CM | POA: Diagnosis not present

## 2016-04-28 LAB — POCT INR: INR: 3.2

## 2016-05-26 ENCOUNTER — Ambulatory Visit (INDEPENDENT_AMBULATORY_CARE_PROVIDER_SITE_OTHER): Payer: PPO | Admitting: Pharmacist

## 2016-05-26 DIAGNOSIS — I4891 Unspecified atrial fibrillation: Secondary | ICD-10-CM | POA: Diagnosis not present

## 2016-05-26 DIAGNOSIS — Z7901 Long term (current) use of anticoagulants: Secondary | ICD-10-CM | POA: Diagnosis not present

## 2016-05-26 LAB — POCT INR: INR: 2.7

## 2016-06-14 ENCOUNTER — Ambulatory Visit (INDEPENDENT_AMBULATORY_CARE_PROVIDER_SITE_OTHER): Payer: PPO | Admitting: Pharmacist

## 2016-06-14 ENCOUNTER — Ambulatory Visit (INDEPENDENT_AMBULATORY_CARE_PROVIDER_SITE_OTHER): Payer: PPO | Admitting: Cardiovascular Disease

## 2016-06-14 ENCOUNTER — Encounter: Payer: Self-pay | Admitting: Cardiovascular Disease

## 2016-06-14 VITALS — BP 130/72 | HR 53 | Ht 67.0 in | Wt 195.2 lb

## 2016-06-14 DIAGNOSIS — Z01812 Encounter for preprocedural laboratory examination: Secondary | ICD-10-CM

## 2016-06-14 DIAGNOSIS — I2510 Atherosclerotic heart disease of native coronary artery without angina pectoris: Secondary | ICD-10-CM | POA: Diagnosis not present

## 2016-06-14 DIAGNOSIS — Z7901 Long term (current) use of anticoagulants: Secondary | ICD-10-CM

## 2016-06-14 DIAGNOSIS — E118 Type 2 diabetes mellitus with unspecified complications: Secondary | ICD-10-CM

## 2016-06-14 DIAGNOSIS — I4891 Unspecified atrial fibrillation: Secondary | ICD-10-CM

## 2016-06-14 DIAGNOSIS — R001 Bradycardia, unspecified: Secondary | ICD-10-CM | POA: Diagnosis not present

## 2016-06-14 DIAGNOSIS — I10 Essential (primary) hypertension: Secondary | ICD-10-CM

## 2016-06-14 DIAGNOSIS — R0602 Shortness of breath: Secondary | ICD-10-CM

## 2016-06-14 DIAGNOSIS — E785 Hyperlipidemia, unspecified: Secondary | ICD-10-CM

## 2016-06-14 DIAGNOSIS — D689 Coagulation defect, unspecified: Secondary | ICD-10-CM | POA: Diagnosis not present

## 2016-06-14 LAB — CBC
HEMATOCRIT: 38.5 % (ref 38.5–50.0)
Hemoglobin: 13.1 g/dL — ABNORMAL LOW (ref 13.2–17.1)
MCH: 35.4 pg — ABNORMAL HIGH (ref 27.0–33.0)
MCHC: 34 g/dL (ref 32.0–36.0)
MCV: 104.1 fL — AB (ref 80.0–100.0)
MPV: 10.4 fL (ref 7.5–12.5)
PLATELETS: 158 10*3/uL (ref 140–400)
RBC: 3.7 MIL/uL — AB (ref 4.20–5.80)
RDW: 14 % (ref 11.0–15.0)
WBC: 6.4 10*3/uL (ref 3.8–10.8)

## 2016-06-14 LAB — POCT INR: INR: 2.8

## 2016-06-14 LAB — PROTIME-INR
INR: 2.5 — ABNORMAL HIGH
Prothrombin Time: 25.2 s — ABNORMAL HIGH (ref 9.0–11.5)

## 2016-06-14 LAB — BASIC METABOLIC PANEL
BUN: 16 mg/dL (ref 7–25)
CALCIUM: 9.4 mg/dL (ref 8.6–10.3)
CO2: 25 mmol/L (ref 20–31)
Chloride: 106 mmol/L (ref 98–110)
Creat: 1.13 mg/dL — ABNORMAL HIGH (ref 0.70–1.11)
GLUCOSE: 90 mg/dL (ref 65–99)
POTASSIUM: 4.8 mmol/L (ref 3.5–5.3)
SODIUM: 139 mmol/L (ref 135–146)

## 2016-06-14 LAB — APTT: aPTT: 42 s — ABNORMAL HIGH (ref 22–34)

## 2016-06-14 LAB — TSH: TSH: 2.4 m[IU]/L (ref 0.40–4.50)

## 2016-06-14 NOTE — Patient Instructions (Addendum)
Dr Sallyanne Kuster recommends that you have a pacemaker implanted on Septmber 5th, 2017. This will be performed at [place] will need blood work done prior to the procedure. The blood work can be done no more than 7 days prior to the procedure. It can be done at any [facility] lab. There is one downstairs on the first floor of this building and one in the [facility] building [phone]).  Your physician has requested that you have an echocardiogram prior to procedure. Echocardiography is a painless test that uses sound waves to create images of your heart. It provides your doctor with information about the size and shape of your heart and how well your heart's chambers and valves are working. This procedure takes approximately one hour. There are no restrictions for this procedure.   Pacemaker Implantation The heart has its own electrical system, or natural pacemaker, to regulate the heartbeat. Sometimes, the natural pacemaker system of the heart fails and causes the heart to beat too slowly. If this happens, a pacemaker can be surgically placed to help the heart beat at a normal or programmed rate. A pacemaker is a small, battery-powered device that is placed under the skin and is programmed to sense your heartbeats. If your heart rate is lower than the programmed rate, the pacemaker will pace your heart. Parts of a pacemaker include:  Wires or leads. The leads are placed in the heart and transmit electricity to the heart. The leads are connected to the pulse generator.  Pulse generator. The pulse generator contains a computer and a memory system. The pulse generator also produces the electrical signal that triggers the heart to beat. A pacemaker may be placed if:  You have a slow heartbeat (bradycardia).  You have fainting (syncope).  Shortness of breath (dyspnea) due to heart problems. LET [facility] CARE PROVIDER KNOW ABOUT:  Any allergies you may  have.  All medicines you are taking, including vitamins, herbs, eye drops, creams, and over-the-counter medicines.  Previous problems you or members of your family have had with the use of anesthetics.  Any blood disorders you have.  Previous surgeries you have had.  Medical conditions you have.  Possibility of pregnancy, if this applies. RISKS AND COMPLICATIONS Generally, pacemaker implantation is a safe procedure. However, problems can occur and include:  Bleeding.  Unable to place the pacemaker under local sedation.  Infection. BEFORE THE PROCEDURE  You will have blood work drawn before the procedure.  Do not use any tobacco products including cigarettes, chewing tobacco, or electronic cigarettes. If you need help quitting, ask your health care provider.  Do not eat or drink anything after midnight on the night before the procedure or as directed by your health care provider.  Ask your health care provider about:  Changing or stopping your regular medicines. This is especially important if you are taking diabetes medicines or blood thinners.  Taking medicines such as aspirin and ibuprofen. These medicines can thin your blood. Do not take these medicines before your procedure if your health care provider asks you not to.  Ask your health care provider if you can take a sip of water with any approved medicines the morning of the procedure. PROCEDURE  The surgery to place a pacemaker is considered a minimally invasive surgical procedure. It is done under a local anesthetic, which is an injection at the incision site that makes the skin numb. You are also given sedation and  pain medicine that makes you drowsy during the procedure.   An intravenous line (IV) will be started in your hand or arm so sedation and pain medicine can be given during the pacemaker procedure.  A numbing medicine will be injected into the skin where the pacemaker is to be placed. A small incision will  then be made into the skin. The pacemaker is usually placed under the skin near the collarbone.  After the incision has been made, the leads will be inserted into a large vein and guided into the heart using X-ray.  Using the same incision that was used to place the leads, a small pocket will be created under the skin to hold the pulse generator. The leads will then be connected to the pulse generator.  The incision site will then be closed. A bandage (dressing) is placed over the pacemaker site. The dressing is removed 24-48 hours afterward. AFTER THE PROCEDURE  You will be taken to a recovery area after the pacemaker implant. Your vital signs such as blood pressure, heart rate, breathing, and oxygen levels will be monitored.  A chest X-ray will be done after the pacemaker has been implanted. This is to make sure the pacemaker and leads are in the correct place.   This information is not intended to replace advice given to you by your health care provider. Make sure you discuss any questions you have with your health care provider.   Document Released: 09/22/2002 Document Revised: 10/23/2014 Document Reviewed: 02/06/2012 Elsevier Interactive Patient Education [employer].

## 2016-06-14 NOTE — Progress Notes (Signed)
Patient ID: Steve Lloyd, male   DOB: 1930/02/15, 82 y.o.   MRN: [id number]    Cardiology Office Note    Date:  06/14/2016   ID:  Steve, Lloyd 1930/01/07, MRN [id number]  PCP:  Steve Pink, MD  Cardiologist:   Steve Klein, MD   Chief Complaint  Patient presents with  . Follow-up  CAD s/p CABG  History of Present Illness:  Steve Lloyd is a 82 y.o. male with CAD s/p coronary bypass surgery in 1998 (single-vessel LIMA to LAD) and has not had recurrent coronary problems since that time. He has permanent atrial fibrillation with slow ventricular response and is on warfarin anticoagulation. He has not had any bleeding complications, stroke or other embolic events.   His wife Steve Lloyd is with him today. She reports that he is minimizing symptoms. She describes him as having exertional dyspnea and fatigue and being sleepy all the time. While Steve Lloyd tries halfheartedly to deny some of these assertions, he does admit to low energy level and constant fatigue. He is only playing 9 holes of golf now. Steve Lloyd also believes she has witnessed apneic spells at night, although he is not a loud snorer. He denies chest pain or syncope.  He is overweight, has diabetes mellitus on oral medications. He takes a statin for hyperlipidemia.    Past Medical History:  Diagnosis Date  . Atrial fibrillation ([place])   . Coronary artery disease   . Diabetes mellitus without complication ([place])   . Hyperlipidemia     Past Surgical History:  Procedure Laterality Date  . CORONARY ARTERY BYPASS GRAFT      Outpatient Medications Prior to Visit  Medication Sig Dispense Refill  . amLODipine (NORVASC) 5 MG tablet Take 5 mg by mouth daily.    Marland Kitchen aspirin 81 MG tablet Take 81 mg by mouth daily.    Marland Kitchen atorvastatin (LIPITOR) 10 MG tablet Take 10 mg by mouth daily.    . benazepril (LOTENSIN) 10 MG tablet Take 10 mg by mouth daily.    . Cyanocobalamin (VITAMIN B-12 IJ) Inject 1 mg as directed every 30 (thirty)  days.     . Multiple Vitamin (MULTIVITAMIN) tablet Take 1 tablet by mouth daily.    Marland Kitchen warfarin (COUMADIN) 5 MG tablet TAKE 1 TABLET BY MOUTH DAILY OR AS DIRECTED (Patient taking differently: Take 5 mg by mouth daily at 6 PM. ) 90 tablet 1  . warfarin (COUMADIN) 5 MG tablet TAKE 1 TABLET BY MOUTH DAILY OR AS DIRECTED 90 tablet 1  . pioglitazone-metformin (ACTOPLUS MET) 15-500 MG per tablet Take 1 tablet by mouth 2 (two) times daily with a meal.     No facility-administered medications prior to visit.      Allergies:   Review of patient's allergies indicates no known allergies.   Social History   Social History  . Marital status: Married    Spouse name: N/A  . Number of children: N/A  . Years of education: N/A   Social History Main Topics  . Smoking status: Former Smoker    Quit date: 10/15/1956  . Smokeless tobacco: Not on file  . Alcohol use Yes     Comment: occas.  . Drug use: No  . Sexual activity: Not on file   Other Topics Concern  . Not on file   Social History Narrative  . No narrative on file     ROS:   Please see the history of present illness.    ROS  All other systems reviewed and are negative.   PHYSICAL EXAM:   VS:  BP 130/72 (BP Location: Right Arm, Patient Position: Sitting, Cuff Size: Normal)   Pulse (!) 53   Ht 5' 7"  (1.702 m)   Wt 195 lb 3.2 oz (88.5 kg)   SpO2 98%   BMI 30.57 kg/m    GEN: Well nourished, well developed, in no acute distress  HEENT: normal  Neck: no JVD, carotid bruits, or masses Cardiac: irregular, no murmurs, rubs, or gallops,no edema  Respiratory:  clear to auscultation bilaterally, normal work of breathing GI: soft, nontender, nondistended, + BS MS: no deformity or atrophy  Skin: warm and dry, no rash Neuro:  Alert and Oriented x 3, Strength and sensation are intact Psych: euthymic mood, full affect  Wt Readings from Last 3 Encounters:  06/14/16 195 lb 3.2 oz (88.5 kg)  11/08/15 188 lb (85.3 kg)  10/26/14 185 lb 1.6  oz (84 kg)      Studies/Labs Reviewed:   EKG:  EKG is ordered today.  The ekg ordered today demonstrates Atrial fibrillation with slow ventricular response  Recent Labs: Total cholesterol 133, triglycerides91, HDL 63, LDL 52,  hemoglobin A1c 5.7% Hemoglobin 13.2, normal LFTs, creatinine 1.1   ASSESSMENT:    1. Symptomatic bradycardia   2. Atrial fibrillation with slow ventricular response ([place])   3. Coronary artery disease involving native coronary artery of native heart without angina pectoris   4. Essential hypertension   5. Hyperlipidemia   6. Long term (current) use of anticoagulants   7. Type 2 diabetes mellitus with complication, without long-term current use of insulin ([place])   8. Shortness of breath   9. Pre-procedure lab exam      PLAN:  In order of problems listed above:  1. Symptomatic bradycardia due to atrial fibrillation with slow ventricular response. I believe Steve Lloyd would benefit from implantation of a single-chamber permanent pacemaker with rate response features. He might do best with a Product/process development scientist with minute ventilation sensor. Like to reevaluate left ventricular ejection fraction before pacemaker implantation. Discussed the risks and benefits of a single-lead permanent pacemaker, possible complications (including pneumothorax, perforation, infection, lead dislodgment, need for reoperation, bleeding, etc.). He understands and agrees to proceed. Plan to hold warfarin for a few days before pacemaker procedure. 2. Permanent AFib: CHADSVasc 6. Spontaneously slow ventricular rate suggests AV node conduction abnormality, now meets criteria for pacemaker implantation. 3. CAD s/p CABG: Asymptomatic, angina free. 4. HTN: controlled 5. HLP: excellent parameters on statin 6. Warfarin. No bleeding complications. No embolic complications. 7. DM: Excellent glycemic control 8. Dyspnea: Check echo. If his symptoms do not improve after pacemaker implantation would  recommend a sleep study.    Medication Adjustments/Labs and Tests Ordered: Current medicines are reviewed at length with the patient today.  Concerns regarding medicines are outlined above.  Medication changes, Labs and Tests ordered today are listed in the Patient Instructions below. Patient Instructions  Dr Sallyanne Kuster recommends that you have a pacemaker implanted on Septmber 5th, 2017. This will be performed at [place] will need blood work done prior to the procedure. The blood work can be done no more than 7 days prior to the procedure. It can be done at any [facility] lab. There is one downstairs on the first floor of this building and one in the [facility] building [phone]).  Your physician has requested that you have an echocardiogram prior to  procedure. Echocardiography is a painless test that uses sound waves to create images of your heart. It provides your doctor with information about the size and shape of your heart and how well your heart's chambers and valves are working. This procedure takes approximately one hour. There are no restrictions for this procedure.   Pacemaker Implantation The heart has its own electrical system, or natural pacemaker, to regulate the heartbeat. Sometimes, the natural pacemaker system of the heart fails and causes the heart to beat too slowly. If this happens, a pacemaker can be surgically placed to help the heart beat at a normal or programmed rate. A pacemaker is a small, battery-powered device that is placed under the skin and is programmed to sense your heartbeats. If your heart rate is lower than the programmed rate, the pacemaker will pace your heart. Parts of a pacemaker include:  Wires or leads. The leads are placed in the heart and transmit electricity to the heart. The leads are connected to the pulse generator.  Pulse generator. The pulse generator contains a computer and a memory system. The  pulse generator also produces the electrical signal that triggers the heart to beat. A pacemaker may be placed if:  You have a slow heartbeat (bradycardia).  You have fainting (syncope).  Shortness of breath (dyspnea) due to heart problems. LET [facility] CARE PROVIDER KNOW ABOUT:  Any allergies you may have.  All medicines you are taking, including vitamins, herbs, eye drops, creams, and over-the-counter medicines.  Previous problems you or members of your family have had with the use of anesthetics.  Any blood disorders you have.  Previous surgeries you have had.  Medical conditions you have.  Possibility of pregnancy, if this applies. RISKS AND COMPLICATIONS Generally, pacemaker implantation is a safe procedure. However, problems can occur and include:  Bleeding.  Unable to place the pacemaker under local sedation.  Infection. BEFORE THE PROCEDURE  You will have blood work drawn before the procedure.  Do not use any tobacco products including cigarettes, chewing tobacco, or electronic cigarettes. If you need help quitting, ask your health care provider.  Do not eat or drink anything after midnight on the night before the procedure or as directed by your health care provider.  Ask your health care provider about:  Changing or stopping your regular medicines. This is especially important if you are taking diabetes medicines or blood thinners.  Taking medicines such as aspirin and ibuprofen. These medicines can thin your blood. Do not take these medicines before your procedure if your health care provider asks you not to.  Ask your health care provider if you can take a sip of water with any approved medicines the morning of the procedure. PROCEDURE  The surgery to place a pacemaker is considered a minimally invasive surgical procedure. It is done under a local anesthetic, which is an injection at the incision site that makes the skin numb. You are also given sedation  and pain medicine that makes you drowsy during the procedure.   An intravenous line (IV) will be started in your hand or arm so sedation and pain medicine can be given during the pacemaker procedure.  A numbing medicine will be injected into the skin where the pacemaker is to be placed. A small incision will then be made into the skin. The pacemaker is usually placed under the skin near the collarbone.  After the incision has been made, the leads will be inserted into a large vein and guided  into the heart using X-ray.  Using the same incision that was used to place the leads, a small pocket will be created under the skin to hold the pulse generator. The leads will then be connected to the pulse generator.  The incision site will then be closed. A bandage (dressing) is placed over the pacemaker site. The dressing is removed 24-48 hours afterward. AFTER THE PROCEDURE  You will be taken to a recovery area after the pacemaker implant. Your vital signs such as blood pressure, heart rate, breathing, and oxygen levels will be monitored.  A chest X-ray will be done after the pacemaker has been implanted. This is to make sure the pacemaker and leads are in the correct place.   This information is not intended to replace advice given to you by your health care provider. Make sure you discuss any questions you have with your health care provider.   Document Released: 09/22/2002 Document Revised: 10/23/2014 Document Reviewed: 02/06/2012 Elsevier Interactive Patient Education 2016 [place], Steve Klein, MD  06/14/2016 5:10 PM    [facility] [place], [place], [place]  [address] Phone: [phone]; Fax: [phone]

## 2016-06-16 ENCOUNTER — Ambulatory Visit (HOSPITAL_COMMUNITY)
Admission: RE | Admit: 2016-06-16 | Discharge: 2016-06-16 | Disposition: A | Discharge status: Home / Self Care | Payer: PPO | Source: Ambulatory Visit | Attending: Cardiovascular Disease | Admitting: Cardiovascular Disease

## 2016-06-16 ENCOUNTER — Other Ambulatory Visit: Payer: Self-pay | Admitting: Cardiovascular Disease

## 2016-06-16 DIAGNOSIS — R0600 Dyspnea, unspecified: Secondary | ICD-10-CM | POA: Diagnosis not present

## 2016-06-16 DIAGNOSIS — I340 Nonrheumatic mitral (valve) insufficiency: Secondary | ICD-10-CM | POA: Insufficient documentation

## 2016-06-16 DIAGNOSIS — R001 Bradycardia, unspecified: Secondary | ICD-10-CM

## 2016-06-16 DIAGNOSIS — I358 Other nonrheumatic aortic valve disorders: Secondary | ICD-10-CM | POA: Insufficient documentation

## 2016-06-16 DIAGNOSIS — Z951 Presence of aortocoronary bypass graft: Secondary | ICD-10-CM | POA: Diagnosis not present

## 2016-06-16 DIAGNOSIS — I482 Chronic atrial fibrillation: Secondary | ICD-10-CM | POA: Insufficient documentation

## 2016-06-16 DIAGNOSIS — E785 Hyperlipidemia, unspecified: Secondary | ICD-10-CM | POA: Insufficient documentation

## 2016-06-16 DIAGNOSIS — R0602 Shortness of breath: Secondary | ICD-10-CM | POA: Insufficient documentation

## 2016-06-16 DIAGNOSIS — I119 Hypertensive heart disease without heart failure: Secondary | ICD-10-CM | POA: Diagnosis not present

## 2016-06-16 DIAGNOSIS — I071 Rheumatic tricuspid insufficiency: Secondary | ICD-10-CM | POA: Insufficient documentation

## 2016-06-16 DIAGNOSIS — I2510 Atherosclerotic heart disease of native coronary artery without angina pectoris: Secondary | ICD-10-CM | POA: Insufficient documentation

## 2016-06-16 DIAGNOSIS — I351 Nonrheumatic aortic (valve) insufficiency: Secondary | ICD-10-CM | POA: Insufficient documentation

## 2016-06-16 NOTE — Progress Notes (Signed)
  Echocardiogram 2D Echocardiogram has been performed.  [name] 06/16/2016, 11:05 AM

## 2016-06-20 ENCOUNTER — Ambulatory Visit (HOSPITAL_COMMUNITY)
Admission: RE | Admit: 2016-06-20 | Discharge: 2016-06-21 | Disposition: A | Discharge status: Home / Self Care | Payer: PPO | Source: Ambulatory Visit | Attending: Cardiovascular Disease | Admitting: Cardiovascular Disease

## 2016-06-20 ENCOUNTER — Ambulatory Visit (HOSPITAL_COMMUNITY)
Admission: RE | Disposition: A | Discharge status: Home / Self Care | Payer: Self-pay | Source: Ambulatory Visit | Attending: Cardiovascular Disease

## 2016-06-20 ENCOUNTER — Encounter (HOSPITAL_COMMUNITY): Payer: Self-pay | Admitting: *Deleted

## 2016-06-20 DIAGNOSIS — I4891 Unspecified atrial fibrillation: Secondary | ICD-10-CM | POA: Diagnosis not present

## 2016-06-20 DIAGNOSIS — E663 Overweight: Secondary | ICD-10-CM | POA: Insufficient documentation

## 2016-06-20 DIAGNOSIS — I4821 Permanent atrial fibrillation: Secondary | ICD-10-CM | POA: Diagnosis present

## 2016-06-20 DIAGNOSIS — Z87891 Personal history of nicotine dependence: Secondary | ICD-10-CM | POA: Diagnosis not present

## 2016-06-20 DIAGNOSIS — I482 Chronic atrial fibrillation: Secondary | ICD-10-CM | POA: Diagnosis not present

## 2016-06-20 DIAGNOSIS — Z7984 Long term (current) use of oral hypoglycemic drugs: Secondary | ICD-10-CM | POA: Diagnosis not present

## 2016-06-20 DIAGNOSIS — E118 Type 2 diabetes mellitus with unspecified complications: Secondary | ICD-10-CM | POA: Diagnosis not present

## 2016-06-20 DIAGNOSIS — I2510 Atherosclerotic heart disease of native coronary artery without angina pectoris: Secondary | ICD-10-CM | POA: Insufficient documentation

## 2016-06-20 DIAGNOSIS — Z7982 Long term (current) use of aspirin: Secondary | ICD-10-CM | POA: Insufficient documentation

## 2016-06-20 DIAGNOSIS — Z950 Presence of cardiac pacemaker: Secondary | ICD-10-CM

## 2016-06-20 DIAGNOSIS — Z951 Presence of aortocoronary bypass graft: Secondary | ICD-10-CM | POA: Insufficient documentation

## 2016-06-20 DIAGNOSIS — R001 Bradycardia, unspecified: Secondary | ICD-10-CM | POA: Diagnosis not present

## 2016-06-20 DIAGNOSIS — Z7901 Long term (current) use of anticoagulants: Secondary | ICD-10-CM | POA: Diagnosis not present

## 2016-06-20 DIAGNOSIS — Z6830 Body mass index (BMI) 30.0-30.9, adult: Secondary | ICD-10-CM | POA: Insufficient documentation

## 2016-06-20 DIAGNOSIS — I10 Essential (primary) hypertension: Secondary | ICD-10-CM | POA: Diagnosis not present

## 2016-06-20 DIAGNOSIS — I517 Cardiomegaly: Secondary | ICD-10-CM | POA: Diagnosis not present

## 2016-06-20 DIAGNOSIS — E785 Hyperlipidemia, unspecified: Secondary | ICD-10-CM | POA: Insufficient documentation

## 2016-06-20 HISTORY — DX: Type 2 diabetes mellitus without complications: E11.9

## 2016-06-20 HISTORY — DX: Calculus of kidney: N20.0

## 2016-06-20 HISTORY — DX: Pneumonia, unspecified organism: J18.9

## 2016-06-20 HISTORY — DX: Unspecified atrial fibrillation: I48.91

## 2016-06-20 HISTORY — PX: INSERT / REPLACE / REMOVE PACEMAKER: SUR710

## 2016-06-20 HISTORY — DX: Presence of cardiac pacemaker: Z95.0

## 2016-06-20 HISTORY — PX: EP IMPLANTABLE DEVICE: SHX172B

## 2016-06-20 LAB — GLUCOSE, CAPILLARY
Glucose-Capillary: 97 mg/dL (ref 65–99)
Glucose-Capillary: 85 mg/dL (ref 65–99)
Glucose-Capillary: 106 mg/dL — ABNORMAL HIGH (ref 65–99)

## 2016-06-20 LAB — PROTIME-INR
INR: 1.15
Prothrombin Time: 14.7 seconds (ref 11.4–15.2)

## 2016-06-20 LAB — SURGICAL PCR SCREEN
MRSA, PCR: NEGATIVE
STAPHYLOCOCCUS AUREUS: NEGATIVE

## 2016-06-20 SURGERY — PACEMAKER IMPLANT
Anesthesia: LOCAL

## 2016-06-20 MED ORDER — CEFAZOLIN SODIUM-DEXTROSE 2-4 GM/100ML-% IV SOLN
INTRAVENOUS | Status: AC
Start: 2016-06-20 — End: 2016-06-20
  Filled 2016-06-20: qty 100

## 2016-06-20 MED ORDER — YOU HAVE A PACEMAKER BOOK
Freq: Once | Status: AC
  Administered 2016-06-20: 18:00:00
  Filled 2016-06-20: qty 1

## 2016-06-20 MED ORDER — SODIUM CHLORIDE 0.9 % IR SOLN
80.0000 mg | Status: DC
  Filled 2016-06-20: qty 2

## 2016-06-20 MED ORDER — CEFAZOLIN SODIUM-DEXTROSE 2-4 GM/100ML-% IV SOLN
2.0000 g | INTRAVENOUS | Status: DC
  Filled 2016-06-20: qty 100

## 2016-06-20 MED ORDER — ATORVASTATIN CALCIUM 10 MG PO TABS
10.0000 mg | ORAL_TABLET | Freq: Every day | ORAL | Status: DC
  Administered 2016-06-20 – 2016-06-21 (×2): 10 mg via ORAL
  Filled 2016-06-20 (×2): qty 1

## 2016-06-20 MED ORDER — SODIUM CHLORIDE 0.9 % IV SOLN
INTRAVENOUS | Status: DC
  Administered 2016-06-20: 14:00:00 via INTRAVENOUS

## 2016-06-20 MED ORDER — LIDOCAINE HCL (PF) 1 % IJ SOLN
INTRAMUSCULAR | Status: AC
  Filled 2016-06-20: qty 60

## 2016-06-20 MED ORDER — CHLORHEXIDINE GLUCONATE 4 % EX LIQD
60.0000 mL | Freq: Once | CUTANEOUS | Status: DC
  Filled 2016-06-20: qty 60

## 2016-06-20 MED ORDER — SODIUM CHLORIDE 0.9 % IR SOLN
Status: AC
  Filled 2016-06-20: qty 2

## 2016-06-20 MED ORDER — MUPIROCIN 2 % EX OINT
TOPICAL_OINTMENT | CUTANEOUS | Status: AC
  Administered 2016-06-20: 1
  Filled 2016-06-20: qty 22

## 2016-06-20 MED ORDER — CEFAZOLIN IN D5W 1 GM/50ML IV SOLN
1.0000 g | Freq: Four times a day (QID) | INTRAVENOUS | Status: AC
  Administered 2016-06-20 – 2016-06-21 (×3): 1 g via INTRAVENOUS
  Filled 2016-06-20 (×3): qty 50

## 2016-06-20 MED ORDER — AMLODIPINE BESYLATE 5 MG PO TABS
5.0000 mg | ORAL_TABLET | Freq: Every day | ORAL | Status: DC
  Administered 2016-06-21: 5 mg via ORAL
  Filled 2016-06-20: qty 1

## 2016-06-20 MED ORDER — ONDANSETRON HCL 4 MG/2ML IJ SOLN: 4.0000 mg | Freq: Four times a day (QID) | INTRAMUSCULAR | Status: DC | PRN

## 2016-06-20 MED ORDER — BENAZEPRIL HCL 20 MG PO TABS
10.0000 mg | ORAL_TABLET | Freq: Every day | ORAL | Status: DC
  Administered 2016-06-21: 10 mg via ORAL
  Filled 2016-06-20: qty 1

## 2016-06-20 MED ORDER — ASPIRIN EC 81 MG PO TBEC
81.0000 mg | DELAYED_RELEASE_TABLET | Freq: Every day | ORAL | Status: DC
Start: 2016-06-21 — End: 2016-06-21
  Administered 2016-06-21: 81 mg via ORAL
  Filled 2016-06-20: qty 1

## 2016-06-20 MED ORDER — PIOGLITAZONE HCL-METFORMIN HCL 15-500 MG PO TABS: 1.0000 | ORAL_TABLET | Freq: Two times a day (BID) | ORAL | Status: DC

## 2016-06-20 MED ORDER — CEFAZOLIN SODIUM-DEXTROSE 2-3 GM-% IV SOLR
INTRAVENOUS | Status: DC | PRN
  Administered 2016-06-20: 2 g via INTRAVENOUS

## 2016-06-20 MED ORDER — ACETAMINOPHEN 325 MG PO TABS
325.0000 mg | ORAL_TABLET | ORAL | Status: DC | PRN
  Administered 2016-06-21: 650 mg via ORAL
  Filled 2016-06-20: qty 2

## 2016-06-20 SURGICAL SUPPLY — 7 items
CABLE SURGICAL S-101-97-12 (CABLE) ×1 IMPLANT
KIT ESSENTIALS PG (KITS) ×1 IMPLANT
LEAD CAPSURE NOVUS 5076-58CM (Lead) ×1 IMPLANT
PAD DEFIB LIFELINK (PAD) ×1 IMPLANT
PPM SENSIA SR SESR01 (Pacemaker) ×1 IMPLANT
SHEATH CLASSIC 7F (SHEATH) ×1 IMPLANT
TRAY PACEMAKER INSERTION (PACKS) ×1 IMPLANT

## 2016-06-20 NOTE — Op Note (Signed)
Procedure report  Procedure performed: 1. Implantation of new single chamber permanent pacemaker 2. Fluoroscopy  Reason for procedure: Symptomatic bradycardia due to atrial fibrillation with slow ventricular response  Procedure performed by: Sanda Klein, MD  Complications: None  Estimated blood loss: <10 mL  Medications administered during procedure: Ancef 2 g intravenously Lidocaine 1% 30 mL locally  Device details: Generator Medtronic [place] model [id number] serial number [id number] H Right ventricular lead Medtronic [phone] [id number] serial number [id number]  Procedure details:  After the risks and benefits of the procedure were discussed the patient provided informed consent and was brought to the cardiac cath lab in the fasting state. The patient was prepped and draped in usual sterile fashion. Local anesthesia with 1% lidocaine was administered to to the left infraclavicular area. A 5-6 cm horizontal incision was made parallel with and 2-3 cm caudal to the left clavicle. Using electrocautery and blunt dissection a prepectoral pocket was created down to the level of the pectoralis major muscle fascia. The pocket was carefully inspected for hemostasis. An antibiotic-soaked sponge was placed in the pocket.  Under fluoroscopic guidance and using the modified Seldinger technique asingle venipuncture was performed to access the left subclavian vein. No difficulty was encountered accessing the vein. A J-tip guidewires was subsequently exchanged for a 7 [place] safe sheath.  Under fluoroscopic guidance the ventricular lead was advanced to level of the mid to apical right ventricular septum and thet active-fixation helix was deployed. Prominent current of injury was seen. Satisfactory pacing and sensing parameters were recorded. There was no evidence of diaphragmatic stimulation at maximum device output. The safe sheath was peeled away and the lead was secured in place with 2-0 silk.  The  antibiotic-soaked sponge was removed from the pocket. The pocket was flushed with copious amounts of antibiotic solution. Reinspection showed excellent hemostasis..  The ventricular lead was connected to the generator and appropriate ventricular pacing was seen. Subsequently the atrial lead was also connected. Repeat testing of the lead parameters later showed excellent values.  The entire system was then carefully inserted in the pocket with care been taking that the leads and device assumed a comfortable position without pressure on the incision. Great care was taken that the leads be located deep to the generator. The pocket was then closed in layers using 2 layers of 2-0 Vicryl and cutaneous staples, after which a sterile dressing was applied.  At the end of the procedure the following lead parameters were encountered:  Sensed R waves 10.9 mV, impedance 735 ohms, threshold 0.7 V at 0.5 ms pulse width.   Sanda Klein, MD, [facility] [phone] office [phone] pager

## 2016-06-20 NOTE — H&P (View-Only) (Signed)
Patient ID: Steve Lloyd, male   DOB: 08-20-1930, 82 y.o.   MRN: [id number]    Cardiology Office Note    Date:  06/14/2016   ID:  Steve Lloyd, Steve Lloyd 05-09-1930, MRN [id number]  PCP:  [place] Pink, MD  Cardiologist:   Sanda Klein, MD   Chief Complaint  Patient presents with  . Follow-up  CAD s/p CABG  History of Present Illness:  Steve Lloyd is a 82 y.o. male with CAD s/p coronary bypass surgery in 1998 (single-vessel LIMA to LAD) and has not had recurrent coronary problems since that time. He has permanent atrial fibrillation with slow ventricular response and is on warfarin anticoagulation. He has not had any bleeding complications, stroke or other embolic events.   His wife [name] is with him today. She reports that he is minimizing symptoms. She describes him as having exertional dyspnea and fatigue and being sleepy all the time. While Steve Lloyd tries halfheartedly to deny some of these assertions, he does admit to low energy level and constant fatigue. He is only playing 9 holes of golf now. [name] also believes she has witnessed apneic spells at night, although he is not a loud snorer. He denies chest pain or syncope.  He is overweight, has diabetes mellitus on oral medications. He takes a statin for hyperlipidemia.    Past Medical History:  Diagnosis Date  . Atrial fibrillation ([place])   . Coronary artery disease   . Diabetes mellitus without complication ([place])   . Hyperlipidemia     Past Surgical History:  Procedure Laterality Date  . CORONARY ARTERY BYPASS GRAFT      Outpatient Medications Prior to Visit  Medication Sig Dispense Refill  . amLODipine (NORVASC) 5 MG tablet Take 5 mg by mouth daily.    Marland Kitchen aspirin 81 MG tablet Take 81 mg by mouth daily.    Marland Kitchen atorvastatin (LIPITOR) 10 MG tablet Take 10 mg by mouth daily.    . benazepril (LOTENSIN) 10 MG tablet Take 10 mg by mouth daily.    . Cyanocobalamin (VITAMIN B-12 IJ) Inject 1 mg as directed every 30 (thirty)  days.     . Multiple Vitamin (MULTIVITAMIN) tablet Take 1 tablet by mouth daily.    Marland Kitchen warfarin (COUMADIN) 5 MG tablet TAKE 1 TABLET BY MOUTH DAILY OR AS DIRECTED (Patient taking differently: Take 5 mg by mouth daily at 6 PM. ) 90 tablet 1  . warfarin (COUMADIN) 5 MG tablet TAKE 1 TABLET BY MOUTH DAILY OR AS DIRECTED 90 tablet 1  . pioglitazone-metformin (ACTOPLUS MET) 15-500 MG per tablet Take 1 tablet by mouth 2 (two) times daily with a meal.     No facility-administered medications prior to visit.      Allergies:   Review of patient's allergies indicates no known allergies.   Social History   Social History  . Marital status: Married    Spouse name: N/A  . Number of children: N/A  . Years of education: N/A   Social History Main Topics  . Smoking status: Former Smoker    Quit date: 10/15/1956  . Smokeless tobacco: Not on file  . Alcohol use Yes     Comment: occas.  . Drug use: No  . Sexual activity: Not on file   Other Topics Concern  . Not on file   Social History Narrative  . No narrative on file     ROS:   Please see the history of present illness.    ROS  All other systems reviewed and are negative.   PHYSICAL EXAM:   VS:  BP 130/72 (BP Location: Right Arm, Patient Position: Sitting, Cuff Size: Normal)   Pulse (!) 53   Ht 5' 7"  (1.702 m)   Wt 195 lb 3.2 oz (88.5 kg)   SpO2 98%   BMI 30.57 kg/m    GEN: Well nourished, well developed, in no acute distress  HEENT: normal  Neck: no JVD, carotid bruits, or masses Cardiac: irregular, no murmurs, rubs, or gallops,no edema  Respiratory:  clear to auscultation bilaterally, normal work of breathing GI: soft, nontender, nondistended, + BS MS: no deformity or atrophy  Skin: warm and dry, no rash Neuro:  Alert and Oriented x 3, Strength and sensation are intact Psych: euthymic mood, full affect  Wt Readings from Last 3 Encounters:  06/14/16 195 lb 3.2 oz (88.5 kg)  11/08/15 188 lb (85.3 kg)  10/26/14 185 lb 1.6  oz (84 kg)      Studies/Labs Reviewed:   EKG:  EKG is ordered today.  The ekg ordered today demonstrates Atrial fibrillation with slow ventricular response  Recent Labs: Total cholesterol 133, triglycerides91, HDL 63, LDL 52,  hemoglobin A1c 5.7% Hemoglobin 13.2, normal LFTs, creatinine 1.1   ASSESSMENT:    1. Symptomatic bradycardia   2. Atrial fibrillation with slow ventricular response ([place])   3. Coronary artery disease involving native coronary artery of native heart without angina pectoris   4. Essential hypertension   5. Hyperlipidemia   6. Long term (current) use of anticoagulants   7. Type 2 diabetes mellitus with complication, without long-term current use of insulin ([place])   8. Shortness of breath   9. Pre-procedure lab exam      PLAN:  In order of problems listed above:  1. Symptomatic bradycardia due to atrial fibrillation with slow ventricular response. I believe [place] would benefit from implantation of a single-chamber permanent pacemaker with rate response features. He might do best with a Product/process development scientist with minute ventilation sensor. Like to reevaluate left ventricular ejection fraction before pacemaker implantation. Discussed the risks and benefits of a single-lead permanent pacemaker, possible complications (including pneumothorax, perforation, infection, lead dislodgment, need for reoperation, bleeding, etc.). He understands and agrees to proceed. Plan to hold warfarin for a few days before pacemaker procedure. 2. Permanent AFib: CHADSVasc 6. Spontaneously slow ventricular rate suggests AV node conduction abnormality, now meets criteria for pacemaker implantation. 3. CAD s/p CABG: Asymptomatic, angina free. 4. HTN: controlled 5. HLP: excellent parameters on statin 6. Warfarin. No bleeding complications. No embolic complications. 7. DM: Excellent glycemic control 8. Dyspnea: Check echo. If his symptoms do not improve after pacemaker implantation would  recommend a sleep study.    Medication Adjustments/Labs and Tests Ordered: Current medicines are reviewed at length with the patient today.  Concerns regarding medicines are outlined above.  Medication changes, Labs and Tests ordered today are listed in the Patient Instructions below. Patient Instructions  Dr Sallyanne Kuster recommends that you have a pacemaker implanted on Septmber 5th, 2017. This will be performed at [place] will need blood work done prior to the procedure. The blood work can be done no more than 7 days prior to the procedure. It can be done at any [facility] lab. There is one downstairs on the first floor of this building and one in the [facility] building [phone] [address]).  Your physician has requested that you have an echocardiogram prior to  procedure. Echocardiography is a painless test that uses sound waves to create images of your heart. It provides your doctor with information about the size and shape of your heart and how well your heart's chambers and valves are working. This procedure takes approximately one hour. There are no restrictions for this procedure.   Pacemaker Implantation The heart has its own electrical system, or natural pacemaker, to regulate the heartbeat. Sometimes, the natural pacemaker system of the heart fails and causes the heart to beat too slowly. If this happens, a pacemaker can be surgically placed to help the heart beat at a normal or programmed rate. A pacemaker is a small, battery-powered device that is placed under the skin and is programmed to sense your heartbeats. If your heart rate is lower than the programmed rate, the pacemaker will pace your heart. Parts of a pacemaker include:  Wires or leads. The leads are placed in the heart and transmit electricity to the heart. The leads are connected to the pulse generator.  Pulse generator. The pulse generator contains a computer and a memory system. The  pulse generator also produces the electrical signal that triggers the heart to beat. A pacemaker may be placed if:  You have a slow heartbeat (bradycardia).  You have fainting (syncope).  Shortness of breath (dyspnea) due to heart problems. LET [facility] CARE PROVIDER KNOW ABOUT:  Any allergies you may have.  All medicines you are taking, including vitamins, herbs, eye drops, creams, and over-the-counter medicines.  Previous problems you or members of your family have had with the use of anesthetics.  Any blood disorders you have.  Previous surgeries you have had.  Medical conditions you have.  Possibility of pregnancy, if this applies. RISKS AND COMPLICATIONS Generally, pacemaker implantation is a safe procedure. However, problems can occur and include:  Bleeding.  Unable to place the pacemaker under local sedation.  Infection. BEFORE THE PROCEDURE  You will have blood work drawn before the procedure.  Do not use any tobacco products including cigarettes, chewing tobacco, or electronic cigarettes. If you need help quitting, ask your health care provider.  Do not eat or drink anything after midnight on the night before the procedure or as directed by your health care provider.  Ask your health care provider about:  Changing or stopping your regular medicines. This is especially important if you are taking diabetes medicines or blood thinners.  Taking medicines such as aspirin and ibuprofen. These medicines can thin your blood. Do not take these medicines before your procedure if your health care provider asks you not to.  Ask your health care provider if you can take a sip of water with any approved medicines the morning of the procedure. PROCEDURE  The surgery to place a pacemaker is considered a minimally invasive surgical procedure. It is done under a local anesthetic, which is an injection at the incision site that makes the skin numb. You are also given sedation  and pain medicine that makes you drowsy during the procedure.   An intravenous line (IV) will be started in your hand or arm so sedation and pain medicine can be given during the pacemaker procedure.  A numbing medicine will be injected into the skin where the pacemaker is to be placed. A small incision will then be made into the skin. The pacemaker is usually placed under the skin near the collarbone.  After the incision has been made, the leads will be inserted into a large vein and guided  into the heart using X-ray.  Using the same incision that was used to place the leads, a small pocket will be created under the skin to hold the pulse generator. The leads will then be connected to the pulse generator.  The incision site will then be closed. A bandage (dressing) is placed over the pacemaker site. The dressing is removed 24-48 hours afterward. AFTER THE PROCEDURE  You will be taken to a recovery area after the pacemaker implant. Your vital signs such as blood pressure, heart rate, breathing, and oxygen levels will be monitored.  A chest X-ray will be done after the pacemaker has been implanted. This is to make sure the pacemaker and leads are in the correct place.   This information is not intended to replace advice given to you by your health care provider. Make sure you discuss any questions you have with your health care provider.   Document Released: 09/22/2002 Document Revised: 10/23/2014 Document Reviewed: 02/06/2012 Elsevier Interactive Patient Education 2016 [place], Sanda Klein, MD  06/14/2016 5:10 PM    [facility] [place], [place], [place]  [address] Phone: [phone]; Fax: [phone]

## 2016-06-20 NOTE — Interval H&P Note (Signed)
History and Physical Interval Note:  06/20/2016 3:19 PM  Steve Lloyd  has presented today for surgery, with the diagnosis of afib with slow response  The various methods of treatment have been discussed with the patient and family. After consideration of risks, benefits and other options for treatment, the patient has consented to  Procedure(s): Pacemaker Implant (N/A) as a surgical intervention .  The patient's history has been reviewed, patient examined, no change in status, stable for surgery.  I have reviewed the patient's chart and labs.  Questions were answered to the patient's satisfaction.     Steve Lloyd

## 2016-06-21 ENCOUNTER — Encounter (HOSPITAL_COMMUNITY): Payer: Self-pay | Admitting: Cardiovascular Disease

## 2016-06-21 ENCOUNTER — Ambulatory Visit (HOSPITAL_COMMUNITY): Payer: PPO

## 2016-06-21 DIAGNOSIS — Z950 Presence of cardiac pacemaker: Secondary | ICD-10-CM

## 2016-06-21 DIAGNOSIS — Z7901 Long term (current) use of anticoagulants: Secondary | ICD-10-CM | POA: Diagnosis not present

## 2016-06-21 DIAGNOSIS — Z6830 Body mass index (BMI) 30.0-30.9, adult: Secondary | ICD-10-CM | POA: Diagnosis not present

## 2016-06-21 DIAGNOSIS — I517 Cardiomegaly: Secondary | ICD-10-CM | POA: Diagnosis not present

## 2016-06-21 DIAGNOSIS — E118 Type 2 diabetes mellitus with unspecified complications: Secondary | ICD-10-CM | POA: Diagnosis not present

## 2016-06-21 DIAGNOSIS — I2510 Atherosclerotic heart disease of native coronary artery without angina pectoris: Secondary | ICD-10-CM | POA: Diagnosis not present

## 2016-06-21 DIAGNOSIS — I10 Essential (primary) hypertension: Secondary | ICD-10-CM | POA: Diagnosis not present

## 2016-06-21 DIAGNOSIS — E785 Hyperlipidemia, unspecified: Secondary | ICD-10-CM | POA: Diagnosis not present

## 2016-06-21 DIAGNOSIS — I482 Chronic atrial fibrillation: Secondary | ICD-10-CM | POA: Diagnosis not present

## 2016-06-21 DIAGNOSIS — E663 Overweight: Secondary | ICD-10-CM | POA: Diagnosis not present

## 2016-06-21 DIAGNOSIS — Z7984 Long term (current) use of oral hypoglycemic drugs: Secondary | ICD-10-CM | POA: Diagnosis not present

## 2016-06-21 DIAGNOSIS — Z951 Presence of aortocoronary bypass graft: Secondary | ICD-10-CM | POA: Diagnosis not present

## 2016-06-21 DIAGNOSIS — Z7982 Long term (current) use of aspirin: Secondary | ICD-10-CM | POA: Diagnosis not present

## 2016-06-21 HISTORY — DX: Presence of cardiac pacemaker: Z95.0

## 2016-06-21 LAB — GLUCOSE, CAPILLARY: GLUCOSE-CAPILLARY: 114 mg/dL — AB (ref 65–99)

## 2016-06-21 MED ORDER — ACETAMINOPHEN 325 MG PO TABS: 325.0000 mg | ORAL_TABLET | ORAL | Status: DC | PRN

## 2016-06-21 NOTE — Discharge Summary (Signed)
Physician Discharge Summary       Patient ID: Steve Lloyd MRN: [id number] DOB/AGE: 80-Jan-1931 80 y.o.  Admit date: 06/20/2016 Discharge date: 06/21/2016 Primary Cardiologist:Dr. Croitoru   Discharge Diagnoses:  Principal Problem:   Symptomatic bradycardia Active Problems:   S/P cardiac pacemaker procedure, [employer] 06/20/16    Atrial fibrillation with slow ventricular response ([place])   Long term (current) use of anticoagulants   Discharged Condition: good  Procedures: 06/20/16  Procedure performed: 1. Implantation of new single chamber permanent pacemaker 2. Fluoroscopy  Reason for procedure: Symptomatic bradycardia due to atrial fibrillation with slow ventricular response  Procedure performed by: Sanda Klein, MD  Complications: None  Estimated blood loss: <10 mL  Medications administered during procedure: Ancef 2 g intravenously Lidocaine 1% 30 mL locally  Device details: Generator Medtronic [place] model [id number] serial number [id number] H Right ventricular lead Medtronic [id number] serial number [id number]  Hospital Course:   82 y.o. male with CAD s/p coronary bypass surgery in 1998 (single-vessel LIMA to LAD) and has not had recurrent coronary problems since that time. He has permanent atrial fibrillation with slow ventricular response and is on warfarin anticoagulation. He has not had any bleeding complications, stroke or other embolic events. When seen by Dr. Sallyanne Kuster pt with fatigue, low energy.  He has decreased his golf time due to this.   With symptomatic bradycardia due to atrial fibrillation with slow ventricular response. Dr. Sallyanne Kuster believes Steve Lloyd would benefit from implantation of a single-chamber permanent pacemaker with rate response features. He might do best with a Product/process development scientist with minute ventilation sensor. Like to reevaluate left ventricular ejection fraction before pacemaker implantation. Warfarin held for a few days before  pacemaker procedure.  Pt presented 06/20/16 and underwent PPM see above.  He tolerated the procedure well. By 06/21/16 he was stable and ready for discharge.  The site was stable with old drainage and minimal swelling.    The device was interrogated and numbers were great.  2V CXR without pneumothorax.     He will resume coumadin at home dose for permanent a fib and  CHADSVasc 6.  appts have been made. Pt and family without questions.  Consults: None  Significant Diagnostic Studies:  BMP Latest Ref Rng & Units 06/14/2016  Glucose 65 - 99 mg/dL 90  BUN 7 - 25 mg/dL 16  Creatinine 0.70 - 1.11 mg/dL 1.13(H)  Sodium 135 - 146 mmol/L 139  Potassium 3.5 - 5.3 mmol/L 4.8  Chloride 98 - 110 mmol/L 106  CO2 20 - 31 mmol/L 25  Calcium 8.6 - 10.3 mg/dL 9.4   CBC Latest Ref Rng & Units 06/14/2016  WBC 3.8 - 10.8 K/uL 6.4  Hemoglobin 13.2 - 17.1 g/dL 13.1(L)  Hematocrit 38.5 - 50.0 % 38.5  Platelets 140 - 400 K/uL 158    CHEST  2 VIEW COMPARISON:  No recent . FINDINGS: Cardiac pacer with lead tip in the right ventricle . Prior CABG. Cardiomegaly. Minimal basilar interstitial prominence. Mild component congestive heart failure cannot be excluded. Low lung volumes. No prominent pleural effusion. No pneumothorax. No acute osseous abnormality .  IMPRESSION: 1. Cardiac pacer with lead tip in the right ventricle. No complicating features. No pneumothorax.  2. Prior CABG. Cardiomegaly with very mild basilar interstitial prominence. A very mild component congestive heart failure cannot be excluded .   Discharge Exam: Blood pressure (!) 151/62, pulse 60, temperature 98.5 F (36.9 C), temperature source Oral, resp. rate 18, height 5' 7"  (1.702 m), weight 195  lb (88.5 kg), SpO2 96 %.  Disposition: Home     Medication List    TAKE these medications   acetaminophen 325 MG tablet Commonly known as:  TYLENOL Take 1-2 tablets (325-650 mg total) by mouth every 4 (four) hours as needed for  mild pain.   amLODipine 5 MG tablet Commonly known as:  NORVASC Take 5 mg by mouth daily.   aspirin 81 MG tablet Take 81 mg by mouth daily.   atorvastatin 10 MG tablet Commonly known as:  LIPITOR Take 10 mg by mouth daily.   benazepril 10 MG tablet Commonly known as:  LOTENSIN Take 10 mg by mouth daily.   multivitamin tablet Take 1 tablet by mouth daily.   pioglitazone-metformin 15-500 MG tablet Commonly known as:  ACTOPLUS MET TAKE 1 TABLET BY MOUTH 2 (TWO) TIMES DAILY WITH MEALS.   VITAMIN B-12 IJ Inject 1 mg as directed every 30 (thirty) days.   warfarin 5 MG tablet Commonly known as:  COUMADIN TAKE 1 TABLET BY MOUTH DAILY OR AS DIRECTED      Follow-up Information    [place] Office Follow up on 06/28/2016.   Specialty:  Cardiology Why:  for wound site check of your pacemaker,  this is the [facility] office at [employer] information: [address], [place] Follow up on 06/23/2016.   Specialty:  Cardiology Why:  at  10:00AM  this is with Christa See information: [place] [phone]       Sanda Klein, MD Follow up on 09/25/2016.   Specialty:  Cardiology Why:  at 11:00 AM Contact information: [address] [place] [address] [phone]            Discharge Instructions:   Heart Healthy Diabetic Diet   Call the office if any swelling or drainage at site.    Call if any questions  Resume your coumadin at previous home dose       Signed: Cecilie Kicks Nurse Practitioner-Certified [facility]: [facility] 06/21/2016, 10:14 AM  Time spent on discharge : > 30 minutes.

## 2016-06-21 NOTE — Progress Notes (Signed)
Subjective: No CP  NO SOB   Objective: Vitals:   06/20/16 1900 06/20/16 1920 06/20/16 2255 06/21/16 0350  BP: 132/61 132/69 (!) 134/47 131/64  Pulse: (!) 59 (!) 59 62 62  Resp: 17 18  16   Temp:  97.8 F (36.6 C)  98.3 F (36.8 C)  TempSrc:  Oral  Oral  SpO2: 93% 94% 95% 96%  Weight:      Height:       Weight change:   Intake/Output Summary (Last 24 hours) at 06/21/16 0814 Last data filed at 06/21/16 0400  Gross per 24 hour  Intake              390 ml  Output             1200 ml  Net             -810 ml    General: Alert, awake, oriented x3, in no acute distress Neck:  JVP is normal Chest  Minimal blood on dressing   Heart: Regular rate and rhythm, without murmurs, rubs, gallops.  Lungs: Clear to auscultation.  No rales or wheezes. Exemities:  No edema.   Neuro: Grossly intact, nonfocal.  Tele  Vent paced   Lab Results: Results for orders placed or performed during the hospital encounter of 06/20/16 (from the past 24 hour(s))  Glucose, capillary     Status: None   Collection Time: 06/20/16  1:20 PM  Result Value Ref Range   Glucose-Capillary 97 65 - 99 mg/dL   Comment 1 Notify RN   Protime-INR     Status: None   Collection Time: 06/20/16  1:30 PM  Result Value Ref Range   Prothrombin Time 14.7 11.4 - 15.2 seconds   INR 1.15   Surgical pcr screen     Status: None   Collection Time: 06/20/16  1:45 PM  Result Value Ref Range   MRSA, PCR NEGATIVE NEGATIVE   Staphylococcus aureus NEGATIVE NEGATIVE  Glucose, capillary     Status: None   Collection Time: 06/20/16  4:54 PM  Result Value Ref Range   Glucose-Capillary 85 65 - 99 mg/dL  Glucose, capillary     Status: Abnormal   Collection Time: 06/20/16  9:25 PM  Result Value Ref Range   Glucose-Capillary 106 (H) 65 - 99 mg/dL   Comment 1 Notify RN    Comment 2 Document in Chart   Glucose, capillary     Status: Abnormal   Collection Time: 06/21/16  5:55 AM  Result Value Ref Range   Glucose-Capillary 114 (H) 65  - 99 mg/dL   Comment 1 Notify RN    Comment 2 Document in Chart     Studies/Results: Dg Chest 2 View  Result Date: 06/21/2016 CLINICAL DATA:  Pacemaker. EXAM: CHEST  2 VIEW COMPARISON:  No recent . FINDINGS: Cardiac pacer with lead tip in the right ventricle . Prior CABG. Cardiomegaly. Minimal basilar interstitial prominence. Mild component congestive heart failure cannot be excluded. Low lung volumes. No prominent pleural effusion. No pneumothorax. No acute osseous abnormality . IMPRESSION: 1. Cardiac pacer with lead tip in the right ventricle. No complicating features. No pneumothorax. 2. Prior CABG. Cardiomegaly with very mild basilar interstitial prominence. A very mild component congestive heart failure cannot be excluded . Electronically Signed   By: Marcello Moores  Register   On: 06/21/2016 07:37    Medications:REviewed @[facility] @  1  PPM  OK to go  Wll f/u with Dr Orene Desanctis as outpt  2  CAD  S/p CABG 1998  No symtpoms of angina    2  Permanent afib  REsume coumadin at home dose    LOS: 0 days   Dorris Carnes 06/21/2016, 8:14 AM

## 2016-06-21 NOTE — Discharge Instructions (Signed)
Supplemental Discharge Instructions for  Pacemaker Patients  Activity Do not raise your left arm above shoulder level or extend it backward beyond shoulder level for 2 weeks. Wear the arm sling as a reminder or as needed for comfort for 2 weeks. No heavy lifting or vigorous activity with your left arm for 6-8 weeks.    NO DRIVING is preferable for 2 weeks; If absolutely necessary, drive only short, familiar routes. DO wear your seatbelt, even if it crosses over the pacemaker site.  WOUND CARE - Keep the wound area clean and dry.  Remove the dressing the day after you return home (usually 48 hours after the procedure). - DO NOT SUBMERGE UNDER WATER UNTIL FULLY HEALED (no tub baths, hot tubs, swimming pools, etc.).  - You  may shower or take a sponge bath after the dressing is removed. DO NOT SOAK the area and do not allow the shower to directly spray on the site. - If you have staples, these will be removed in the office in 7-14 days. - If you have tape/steri-strips on your wound, these will fall off; do not pull them off prematurely.   - No bandage is needed on the site.  DO  NOT apply any creams, oils, or ointments to the wound area. - If you notice any drainage or discharge from the wound, any swelling, excessive redness or bruising at the site, or if you develop a fever > 101? F after you are discharged home, call the office at once.  Special Instructions - You are still able to use cellular telephones.  Avoid carrying your cellular phone near your device. - When traveling through airports, show security personnel your identification card to avoid being screened in the metal detectors.  - Avoid arc welding equipment, MRI testing (magnetic resonance imaging), TENS units (transcutaneous nerve stimulators).  Call the office for questions about other devices. - Avoid electrical appliances that are in poor condition or are not properly grounded. - Microwave ovens are safe to be near or to  operate.  Heart Healthy Diabetic Diet   Call the office if any swelling or drainage at site.    Call if any questions  Resume your coumadin at previous home dose

## 2016-06-21 NOTE — Care Management Note (Addendum)
Case Management Note  Patient Details  Name: Steve Lloyd MRN: [id number] Date of Birth: [birth year]  Subjective/Objective:     Patient s/p pacemaker implant, No needs.                Action/Plan:   Expected Discharge Date:                  Expected Discharge Plan:  Home/Self Care  In-House Referral:     Discharge planning Services  CM Consult  Post Acute Care Choice:    Choice offered to:     DME Arranged:    DME Agency:     HH Arranged:    HH Agency:     Status of Service:  In process, will continue to follow  If discussed at Long Length of Stay Meetings, dates discussed:    Additional Comments:  Zenon Mayo, RN 06/21/2016, 9:07 AM

## 2016-06-23 ENCOUNTER — Ambulatory Visit (INDEPENDENT_AMBULATORY_CARE_PROVIDER_SITE_OTHER): Payer: PPO | Admitting: Pharmacist Clinician (PhC)/ Clinical Pharmacy Specialist

## 2016-06-23 DIAGNOSIS — I4891 Unspecified atrial fibrillation: Secondary | ICD-10-CM | POA: Diagnosis not present

## 2016-06-23 DIAGNOSIS — Z7901 Long term (current) use of anticoagulants: Secondary | ICD-10-CM

## 2016-06-23 LAB — POCT INR: INR: 1.1

## 2016-06-23 MED FILL — Lidocaine HCl Local Preservative Free (PF) Inj 1%: INTRAMUSCULAR | Qty: 30 | Status: AC

## 2016-06-23 MED FILL — Sodium Chloride Irrigation Soln 0.9%: Qty: 500 | Status: AC

## 2016-06-23 MED FILL — Gentamicin Sulfate Inj 40 MG/ML: INTRAMUSCULAR | Qty: 2 | Status: AC

## 2016-06-26 ENCOUNTER — Telehealth: Payer: Self-pay

## 2016-06-26 DIAGNOSIS — I272 Pulmonary hypertension, unspecified: Secondary | ICD-10-CM

## 2016-06-26 NOTE — Telephone Encounter (Signed)
Order placed.  Patient aware.  

## 2016-06-26 NOTE — Telephone Encounter (Signed)
Notes Recorded by Sanda Klein, MD on 06/16/2016 at 5:11 PM EDT Heart pumping function remains normal on echo. Has moderate pulmonary hypertension. Please ask him if he has ever had an evaluation for obstructive sleep apnea (this won't have an impact on the decision to proceed with pacemaker therapy, but it may be worthwhile doing a sleep study if one has not been performed in the past).

## 2016-06-28 ENCOUNTER — Ambulatory Visit (INDEPENDENT_AMBULATORY_CARE_PROVIDER_SITE_OTHER): Payer: PPO | Admitting: *Deleted

## 2016-06-28 DIAGNOSIS — I4891 Unspecified atrial fibrillation: Secondary | ICD-10-CM | POA: Diagnosis not present

## 2016-06-28 LAB — CUP PACEART INCLINIC DEVICE CHECK
Battery Impedance: 100 Ohm
Battery Remaining Longevity: 105 mo
Battery Voltage: 2.8 V
Lead Channel Impedance Value: 0 Ohm
Lead Channel Pacing Threshold Amplitude: 0.5 V
Lead Channel Pacing Threshold Pulse Width: 0.4 ms
Lead Channel Sensing Intrinsic Amplitude: 11.2 mV
Lead Channel Setting Pacing Amplitude: 3.5 V
Lead Channel Setting Pacing Pulse Width: 0.4 ms
MDC IDC LEAD IMPLANT DT: 20170905
MDC IDC LEAD LOCATION: 753860
MDC IDC MSMT LEADCHNL RV IMPEDANCE VALUE: 611 Ohm
MDC IDC MSMT LEADCHNL RV PACING THRESHOLD AMPLITUDE: 0.5 V
MDC IDC MSMT LEADCHNL RV PACING THRESHOLD PULSEWIDTH: 0.4 ms
MDC IDC SESS DTM: 20170913170143
MDC IDC SET LEADCHNL RV SENSING SENSITIVITY: 4 mV
MDC IDC STAT BRADY RV PERCENT PACED: 95 %

## 2016-06-28 NOTE — Progress Notes (Signed)
Wound check appointment. Tegaderm and gauze removed from device site. Staples removed from incision. Wound without redness or edema. Incision edges approximated, wound well healed. Normal device function. Threshold, sensing, and impedance consistent with implant measurements. Device programmed at 3.5V for extra safety margin until 3 month visit. Histogram distribution appropriate for patient and level of activity. No high ventricular rates noted. Patient educated about wound care, arm mobility, lifting restrictions. ROV in 3 months with MC.

## 2016-06-29 DIAGNOSIS — L570 Actinic keratosis: Secondary | ICD-10-CM | POA: Diagnosis not present

## 2016-06-29 DIAGNOSIS — D2261 Melanocytic nevi of right upper limb, including shoulder: Secondary | ICD-10-CM | POA: Diagnosis not present

## 2016-06-29 DIAGNOSIS — D225 Melanocytic nevi of trunk: Secondary | ICD-10-CM | POA: Diagnosis not present

## 2016-06-29 DIAGNOSIS — L820 Inflamed seborrheic keratosis: Secondary | ICD-10-CM | POA: Diagnosis not present

## 2016-06-29 DIAGNOSIS — D2271 Melanocytic nevi of right lower limb, including hip: Secondary | ICD-10-CM | POA: Diagnosis not present

## 2016-06-29 DIAGNOSIS — D485 Neoplasm of uncertain behavior of skin: Secondary | ICD-10-CM | POA: Diagnosis not present

## 2016-06-29 DIAGNOSIS — Z85828 Personal history of other malignant neoplasm of skin: Secondary | ICD-10-CM | POA: Diagnosis not present

## 2016-06-29 DIAGNOSIS — X32XXXA Exposure to sunlight, initial encounter: Secondary | ICD-10-CM | POA: Diagnosis not present

## 2016-07-26 ENCOUNTER — Ambulatory Visit (INDEPENDENT_AMBULATORY_CARE_PROVIDER_SITE_OTHER): Payer: PPO | Admitting: Pharmacist

## 2016-07-26 DIAGNOSIS — I4891 Unspecified atrial fibrillation: Secondary | ICD-10-CM | POA: Diagnosis not present

## 2016-07-26 DIAGNOSIS — Z7901 Long term (current) use of anticoagulants: Secondary | ICD-10-CM | POA: Diagnosis not present

## 2016-07-26 LAB — POCT INR: INR: 2.2

## 2016-08-03 ENCOUNTER — Telehealth: Payer: Self-pay | Admitting: Cardiovascular Disease

## 2016-08-03 NOTE — Telephone Encounter (Signed)
New Message  Pt call to inform RN he will be out of town for the next 3-4 days; away from his machine. Please call back to discuss if needed

## 2016-08-03 NOTE — Telephone Encounter (Signed)
Noted  

## 2016-08-09 ENCOUNTER — Encounter (HOSPITAL_BASED_OUTPATIENT_CLINIC_OR_DEPARTMENT_OTHER): Payer: PPO

## 2016-08-23 ENCOUNTER — Ambulatory Visit (INDEPENDENT_AMBULATORY_CARE_PROVIDER_SITE_OTHER): Payer: PPO | Admitting: Pharmacist Clinician (PhC)/ Clinical Pharmacy Specialist

## 2016-08-23 DIAGNOSIS — Z7901 Long term (current) use of anticoagulants: Secondary | ICD-10-CM

## 2016-08-23 DIAGNOSIS — I4891 Unspecified atrial fibrillation: Secondary | ICD-10-CM

## 2016-08-23 LAB — POCT INR: INR: 2.1

## 2016-09-25 ENCOUNTER — Ambulatory Visit (INDEPENDENT_AMBULATORY_CARE_PROVIDER_SITE_OTHER): Payer: PPO | Admitting: Cardiovascular Disease

## 2016-09-25 ENCOUNTER — Encounter: Payer: Self-pay | Admitting: Cardiovascular Disease

## 2016-09-25 ENCOUNTER — Ambulatory Visit (INDEPENDENT_AMBULATORY_CARE_PROVIDER_SITE_OTHER): Payer: PPO | Admitting: Pharmacist

## 2016-09-25 VITALS — BP 138/76 | HR 69 | Ht 67.0 in | Wt 193.6 lb

## 2016-09-25 DIAGNOSIS — Z7901 Long term (current) use of anticoagulants: Secondary | ICD-10-CM

## 2016-09-25 DIAGNOSIS — Z950 Presence of cardiac pacemaker: Secondary | ICD-10-CM | POA: Diagnosis not present

## 2016-09-25 DIAGNOSIS — I2510 Atherosclerotic heart disease of native coronary artery without angina pectoris: Secondary | ICD-10-CM | POA: Diagnosis not present

## 2016-09-25 DIAGNOSIS — I4891 Unspecified atrial fibrillation: Secondary | ICD-10-CM

## 2016-09-25 DIAGNOSIS — I10 Essential (primary) hypertension: Secondary | ICD-10-CM

## 2016-09-25 DIAGNOSIS — E118 Type 2 diabetes mellitus with unspecified complications: Secondary | ICD-10-CM

## 2016-09-25 DIAGNOSIS — E7800 Pure hypercholesterolemia, unspecified: Secondary | ICD-10-CM

## 2016-09-25 LAB — POCT INR: INR: 2.6

## 2016-09-25 MED ORDER — WARFARIN SODIUM 5 MG PO TABS: ORAL_TABLET | ORAL | 1 refills | Status: DC

## 2016-09-25 NOTE — Patient Instructions (Signed)
Dr Sallyanne Kuster recommends that you continue on your current medications as directed. Please refer to the Current Medication list given to you today.  Remote monitoring is used to monitor your Pacemaker of ICD from home. This monitoring reduces the number of office visits required to check your device to one time per year. It allows [place] to keep an eye on the functioning of your device to ensure it is working properly. You are scheduled for a device check from home on Monday, March 12th, 2018. You may send your transmission at any time that day. If you have a wireless device, the transmission will be sent automatically. After your physician reviews your transmission, you will receive a postcard with your next transmission date.  Dr Sallyanne Kuster recommends that you schedule a follow-up appointment in 12 months with a pacemaker check. You will receive a reminder letter in the mail two months in advance. If you don't receive a letter, please call our office to schedule the follow-up appointment.  If you need a refill on your cardiac medications before your next appointment, please call your pharmacy.

## 2016-09-25 NOTE — Progress Notes (Signed)
Patient ID: Steve Lloyd, male   DOB: 26-Mar-1930, 82 y.o.   MRN: [id number]    Cardiology Office Note    Date:  09/25/2016   ID:  Steve Lloyd, DOB 04/03/1930, MRN [id number]  PCP:  Steve Pink, MD  Cardiologist:   Steve Klein, MD   Chief Complaint  Patient presents with  . Follow-up    PT HAS NO COMPLAINTS TODAY   CAD s/p CABG  History of Present Illness:  Steve Lloyd is a 82 y.o. male with CAD s/p coronary bypass surgery in 1998 (single-vessel LIMA to LAD) and has not had recurrent coronary problems since that time. He has permanent atrial fibrillation with slow ventricular response and is on warfarin anticoagulation. Due to complaints of exertional fatigue and dyspnea he underwent implantation of a single-chamber permanent pacemaker (Medtronic [place] [id number]) in September 2017  He has felt substantial improvement in stamina and breathing since that time. The pacemaker site has healed well. His does not have any musculoskeletal complaints. He has not returned to playing golf since pacemaker implantation, but is eager to do so.  Device interrogation shows normal function. Estimated generator longevity is 10 years. The device is programmed VVIR with a lower rate limit of 60 bpm and even with these conservative settings he has 98.5% ventricular pacing. Heart rate histograms are appropriate. A single episode of rapid ventricular response of 6 seconds was recorded since device implantation.  He is overweight, has diabetes mellitus on oral medications. He takes a statin for hyperlipidemia.    Past Medical History:  Diagnosis Date  . Atrial fibrillation with slow ventricular response ([place])    hx/notes 06/20/2016  . Coronary artery disease   . Hyperlipidemia   . Kidney stones   . Pneumonia ~ 1950  . Presence of permanent cardiac pacemaker   . S/P cardiac pacemaker procedure, [employer] 06/20/16  06/21/2016  . Type II diabetes mellitus ([place])     Past Surgical History:    Procedure Laterality Date  . APPENDECTOMY    . CATARACT EXTRACTION W/ INTRAOCULAR LENS  IMPLANT, BILATERAL Bilateral   . CORONARY ARTERY BYPASS GRAFT  1998   single-vessel LIMA to LAD/notes 06/20/2016  . EP IMPLANTABLE DEVICE N/A 06/20/2016   Procedure: Pacemaker Implant;  Surgeon: Steve Klein, MD;  Location: [facility];  Service: Cardiovascular;  Laterality: N/A;  . INGUINAL HERNIA REPAIR Right   . INSERT / REPLACE / REMOVE PACEMAKER  06/20/2016    Outpatient Medications Prior to Visit  Medication Sig Dispense Refill  . acetaminophen (TYLENOL) 325 MG tablet Take 1-2 tablets (325-650 mg total) by mouth every 4 (four) hours as needed for mild pain.    Marland Kitchen amLODipine (NORVASC) 5 MG tablet Take 5 mg by mouth daily.    Marland Kitchen aspirin 81 MG tablet Take 81 mg by mouth daily.    Marland Kitchen atorvastatin (LIPITOR) 10 MG tablet Take 10 mg by mouth daily.    . benazepril (LOTENSIN) 10 MG tablet Take 10 mg by mouth daily.    . Cyanocobalamin (VITAMIN B-12 IJ) Inject 1 mg as directed every 30 (thirty) days.     . Multiple Vitamin (MULTIVITAMIN) tablet Take 1 tablet by mouth daily.    . pioglitazone-metformin (ACTOPLUS MET) 15-500 MG tablet TAKE 1 TABLET BY MOUTH 2 (TWO) TIMES DAILY WITH MEALS.  1  . warfarin (COUMADIN) 5 MG tablet TAKE 1 TABLET BY MOUTH DAILY OR AS DIRECTED 90 tablet 1   No facility-administered medications prior to visit.  Allergies:   Patient has no known allergies.   Social History   Social History  . Marital status: Married    Spouse name: N/A  . Number of children: N/A  . Years of education: N/A   Social History Main Topics  . Smoking status: Former Smoker    Packs/day: 1.00    Years: 8.00    Quit date: 10/15/1956  . Smokeless tobacco: Never Used  . Alcohol use 8.4 oz/week    14 Glasses of wine per week     Comment: 06/20/2016 "2 glasses of wine q night"  . Drug use: No  . Sexual activity: Not Asked   Other Topics Concern  . None   Social History Narrative  .  None     ROS:   Please see the history of present illness.    ROS All other systems reviewed and are negative.   PHYSICAL EXAM:   VS:  BP 138/76 (BP Location: Left Arm, Patient Position: Sitting, Cuff Size: Normal)   Pulse 69   Ht 5' 7"  (1.702 m)   Wt 193 lb 9.6 oz (87.8 kg)   SpO2 98%   BMI 30.32 kg/m    GEN: Well nourished, well developed, in no acute distress  HEENT: normal  Neck: no JVD, carotid bruits, or masses Cardiac: Regular rate and rhythm, paradoxically split S2, no murmurs, rubs, or gallops,no edema . Well-healed pacemaker site in the left subclavian area Respiratory:  clear to auscultation bilaterally, normal work of breathing GI: soft, nontender, nondistended, + BS MS: no deformity or atrophy  Skin: warm and dry, no rash Neuro:  Alert and Oriented x 3, Strength and sensation are intact Psych: euthymic mood, full affect  Wt Readings from Last 3 Encounters:  09/25/16 193 lb 9.6 oz (87.8 kg)  06/20/16 195 lb (88.5 kg)  06/14/16 195 lb 3.2 oz (88.5 kg)      Studies/Labs Reviewed:   EKG:  EKG is ordered today.  The ekg ordered today demonstrates Atrial fibrillation with slow ventricular response  Recent Labs: Total cholesterol 133, triglycerides91, HDL 63, LDL 52,  hemoglobin A1c 5.7% Hemoglobin 13.2, normal LFTs, creatinine 1.1   ASSESSMENT:    1. Atrial fibrillation with slow ventricular response ([place])   2. S/P cardiac pacemaker procedure, [employer] 06/20/16    3. Coronary artery disease involving native coronary artery of native heart without angina pectoris   4. Essential hypertension   5. Pure hypercholesterolemia   6. Long term current use of anticoagulant therapy   7. Type 2 diabetes mellitus with complication, without long-term current use of insulin (HCC)      PLAN:  In order of problems listed above:  1. Symptomatic bradycardia due to atrial fibrillation with slow ventricular response. Good clinical response to pacemaker  implantation 2.  PPM: Normal device function. Well-healed site. Discussed remote pacemaker monitoring via [employer] on an every three-month basis.  3. CAD s/p CABG: Asymptomatic, angina free. 4. HTN: controlled 5. HLP: excellent parameters on statin 6. Warfarin. No bleeding complications. No embolic complications. 7. DM: Excellent glycemic control     Medication Adjustments/Labs and Tests Ordered: Current medicines are reviewed at length with the patient today.  Concerns regarding medicines are outlined above.  Medication changes, Labs and Tests ordered today are listed in the Patient Instructions below. Patient Instructions  Dr Sallyanne Kuster recommends that you continue on your current medications as directed. Please refer to the Current Medication list given to you today.  Remote monitoring is used  to monitor your Pacemaker of ICD from home. This monitoring reduces the number of office visits required to check your device to one time per year. It allows [place] to keep an eye on the functioning of your device to ensure it is working properly. You are scheduled for a device check from home on Monday, March 12th, 2018. You may send your transmission at any time that day. If you have a wireless device, the transmission will be sent automatically. After your physician reviews your transmission, you will receive a postcard with your next transmission date.  Dr Sallyanne Kuster recommends that you schedule a follow-up appointment in 12 months with a pacemaker check. You will receive a reminder letter in the mail two months in advance. If you don't receive a letter, please call our office to schedule the follow-up appointment.  If you need a refill on your cardiac medications before your next appointment, please call your pharmacy.      Signed, Steve Klein, MD  09/25/2016 1:55 PM    [facility] [address], [place], [place]  [address] Phone: [phone]; Fax: [phone]

## 2016-10-26 LAB — CUP PACEART INCLINIC DEVICE CHECK
Date Time Interrogation Session: 20180111164541
Implantable Lead Implant Date: 20170905
Implantable Lead Model: 5076
MDC IDC LEAD LOCATION: 753860
MDC IDC PG IMPLANT DT: 20170905

## 2016-11-06 ENCOUNTER — Ambulatory Visit (INDEPENDENT_AMBULATORY_CARE_PROVIDER_SITE_OTHER): Payer: PPO | Admitting: Pharmacist

## 2016-11-06 DIAGNOSIS — Z7901 Long term (current) use of anticoagulants: Secondary | ICD-10-CM

## 2016-11-06 DIAGNOSIS — I4891 Unspecified atrial fibrillation: Secondary | ICD-10-CM

## 2016-11-06 LAB — POCT INR: INR: 4

## 2016-11-17 ENCOUNTER — Other Ambulatory Visit: Payer: Self-pay | Admitting: Cardiovascular Disease

## 2016-11-27 ENCOUNTER — Ambulatory Visit (INDEPENDENT_AMBULATORY_CARE_PROVIDER_SITE_OTHER): Payer: PPO | Admitting: Pharmacist Clinician (PhC)/ Clinical Pharmacy Specialist

## 2016-11-27 DIAGNOSIS — Z7901 Long term (current) use of anticoagulants: Secondary | ICD-10-CM

## 2016-11-27 DIAGNOSIS — I4891 Unspecified atrial fibrillation: Secondary | ICD-10-CM

## 2016-11-27 LAB — POCT INR: INR: 2.8

## 2016-12-07 DIAGNOSIS — E119 Type 2 diabetes mellitus without complications: Secondary | ICD-10-CM | POA: Diagnosis not present

## 2016-12-25 ENCOUNTER — Telehealth: Payer: Self-pay | Admitting: Cardiology

## 2016-12-25 ENCOUNTER — Encounter: Payer: PPO | Admitting: *Deleted

## 2016-12-25 NOTE — Telephone Encounter (Signed)
Spoke with pt and reminded pt of remote transmission that is due today. Pt verbalized understanding.   

## 2016-12-29 ENCOUNTER — Encounter: Payer: Self-pay | Admitting: Cardiology

## 2017-01-03 ENCOUNTER — Ambulatory Visit (INDEPENDENT_AMBULATORY_CARE_PROVIDER_SITE_OTHER): Payer: PPO | Admitting: *Deleted

## 2017-01-03 DIAGNOSIS — R001 Bradycardia, unspecified: Secondary | ICD-10-CM

## 2017-01-04 ENCOUNTER — Encounter: Payer: Self-pay | Admitting: Cardiology

## 2017-01-04 NOTE — Progress Notes (Signed)
Remote pacemaker transmission.   

## 2017-01-06 LAB — CUP PACEART REMOTE DEVICE CHECK
Battery Impedance: 111 Ohm
Battery Voltage: 2.78 V
Date Time Interrogation Session: 20180321185953
Implantable Lead Location: 753860
Lead Channel Impedance Value: 0 Ohm
Lead Channel Setting Pacing Amplitude: 2 V
MDC IDC LEAD IMPLANT DT: 20170905
MDC IDC MSMT BATTERY REMAINING LONGEVITY: 118 mo
MDC IDC MSMT LEADCHNL RV IMPEDANCE VALUE: 600 Ohm
MDC IDC MSMT LEADCHNL RV PACING THRESHOLD AMPLITUDE: 0.375 V
MDC IDC MSMT LEADCHNL RV PACING THRESHOLD PULSEWIDTH: 0.4 ms
MDC IDC PG IMPLANT DT: 20170905
MDC IDC SET LEADCHNL RV PACING PULSEWIDTH: 0.4 ms
MDC IDC SET LEADCHNL RV SENSING SENSITIVITY: 4 mV
MDC IDC STAT BRADY RV PERCENT PACED: 99 %

## 2017-01-10 ENCOUNTER — Ambulatory Visit (INDEPENDENT_AMBULATORY_CARE_PROVIDER_SITE_OTHER): Payer: PPO | Admitting: Pharmacist

## 2017-01-10 DIAGNOSIS — Z7901 Long term (current) use of anticoagulants: Secondary | ICD-10-CM

## 2017-01-10 DIAGNOSIS — I4891 Unspecified atrial fibrillation: Secondary | ICD-10-CM | POA: Diagnosis not present

## 2017-01-10 LAB — POCT INR: INR: 3

## 2017-02-22 ENCOUNTER — Ambulatory Visit (INDEPENDENT_AMBULATORY_CARE_PROVIDER_SITE_OTHER): Payer: PPO | Admitting: Pharmacist Clinician (PhC)/ Clinical Pharmacy Specialist

## 2017-02-22 DIAGNOSIS — Z7901 Long term (current) use of anticoagulants: Secondary | ICD-10-CM

## 2017-02-22 DIAGNOSIS — I4891 Unspecified atrial fibrillation: Secondary | ICD-10-CM

## 2017-02-22 LAB — POCT INR: INR: 2.7

## 2017-02-22 NOTE — Progress Notes (Signed)
6 

## 2017-04-04 ENCOUNTER — Ambulatory Visit (INDEPENDENT_AMBULATORY_CARE_PROVIDER_SITE_OTHER): Payer: PPO | Admitting: *Deleted

## 2017-04-04 DIAGNOSIS — R001 Bradycardia, unspecified: Secondary | ICD-10-CM | POA: Diagnosis not present

## 2017-04-05 ENCOUNTER — Ambulatory Visit (INDEPENDENT_AMBULATORY_CARE_PROVIDER_SITE_OTHER): Payer: PPO | Admitting: Pharmacist Clinician (PhC)/ Clinical Pharmacy Specialist

## 2017-04-05 DIAGNOSIS — Z7901 Long term (current) use of anticoagulants: Secondary | ICD-10-CM

## 2017-04-05 DIAGNOSIS — I4891 Unspecified atrial fibrillation: Secondary | ICD-10-CM

## 2017-04-05 LAB — POCT INR: INR: 2.7

## 2017-04-05 NOTE — Progress Notes (Signed)
Remote pacemaker transmission.   

## 2017-04-06 ENCOUNTER — Encounter: Payer: Self-pay | Admitting: Cardiology

## 2017-04-10 DIAGNOSIS — E119 Type 2 diabetes mellitus without complications: Secondary | ICD-10-CM | POA: Diagnosis not present

## 2017-04-10 DIAGNOSIS — Z0000 Encounter for general adult medical examination without abnormal findings: Secondary | ICD-10-CM | POA: Diagnosis not present

## 2017-04-10 DIAGNOSIS — Z125 Encounter for screening for malignant neoplasm of prostate: Secondary | ICD-10-CM | POA: Diagnosis not present

## 2017-04-11 LAB — CUP PACEART REMOTE DEVICE CHECK
Battery Impedance: 135 Ohm
Battery Remaining Longevity: 114 mo
Battery Voltage: 2.78 V
Brady Statistic RV Percent Paced: 98 %
Date Time Interrogation Session: 20180620132044
Implantable Lead Model: 5076
Lead Channel Impedance Value: 0 Ohm
Lead Channel Pacing Threshold Amplitude: 0.375 V
Lead Channel Setting Pacing Amplitude: 2 V
Lead Channel Setting Pacing Pulse Width: 0.4 ms
MDC IDC LEAD IMPLANT DT: 20170905
MDC IDC LEAD LOCATION: 753860
MDC IDC MSMT LEADCHNL RV IMPEDANCE VALUE: 610 Ohm
MDC IDC MSMT LEADCHNL RV PACING THRESHOLD PULSEWIDTH: 0.4 ms
MDC IDC PG IMPLANT DT: 20170905
MDC IDC SET LEADCHNL RV SENSING SENSITIVITY: 4 mV

## 2017-04-24 DIAGNOSIS — E119 Type 2 diabetes mellitus without complications: Secondary | ICD-10-CM | POA: Diagnosis not present

## 2017-04-24 DIAGNOSIS — Z0000 Encounter for general adult medical examination without abnormal findings: Secondary | ICD-10-CM | POA: Diagnosis not present

## 2017-04-24 DIAGNOSIS — R319 Hematuria, unspecified: Secondary | ICD-10-CM | POA: Diagnosis not present

## 2017-04-24 DIAGNOSIS — I10 Essential (primary) hypertension: Secondary | ICD-10-CM | POA: Diagnosis not present

## 2017-04-24 DIAGNOSIS — Z125 Encounter for screening for malignant neoplasm of prostate: Secondary | ICD-10-CM | POA: Diagnosis not present

## 2017-05-17 ENCOUNTER — Ambulatory Visit (INDEPENDENT_AMBULATORY_CARE_PROVIDER_SITE_OTHER): Payer: PPO | Admitting: Pharmacist Clinician (PhC)/ Clinical Pharmacy Specialist

## 2017-05-17 DIAGNOSIS — I4891 Unspecified atrial fibrillation: Secondary | ICD-10-CM | POA: Diagnosis not present

## 2017-05-17 DIAGNOSIS — Z7901 Long term (current) use of anticoagulants: Secondary | ICD-10-CM | POA: Diagnosis not present

## 2017-05-17 LAB — POCT INR: INR: 2.7

## 2017-05-24 DIAGNOSIS — R319 Hematuria, unspecified: Secondary | ICD-10-CM | POA: Diagnosis not present

## 2017-06-12 DIAGNOSIS — R972 Elevated prostate specific antigen [PSA]: Secondary | ICD-10-CM | POA: Diagnosis not present

## 2017-06-19 DIAGNOSIS — R972 Elevated prostate specific antigen [PSA]: Secondary | ICD-10-CM | POA: Diagnosis not present

## 2017-06-19 DIAGNOSIS — N402 Nodular prostate without lower urinary tract symptoms: Secondary | ICD-10-CM | POA: Diagnosis not present

## 2017-06-28 ENCOUNTER — Ambulatory Visit (INDEPENDENT_AMBULATORY_CARE_PROVIDER_SITE_OTHER): Payer: PPO | Admitting: Pharmacist

## 2017-06-28 DIAGNOSIS — I4891 Unspecified atrial fibrillation: Secondary | ICD-10-CM | POA: Diagnosis not present

## 2017-06-28 DIAGNOSIS — Z7901 Long term (current) use of anticoagulants: Secondary | ICD-10-CM

## 2017-06-28 LAB — POCT INR: INR: 3

## 2017-06-29 DIAGNOSIS — D485 Neoplasm of uncertain behavior of skin: Secondary | ICD-10-CM | POA: Diagnosis not present

## 2017-06-29 DIAGNOSIS — L570 Actinic keratosis: Secondary | ICD-10-CM | POA: Diagnosis not present

## 2017-06-29 DIAGNOSIS — Z85828 Personal history of other malignant neoplasm of skin: Secondary | ICD-10-CM | POA: Diagnosis not present

## 2017-06-29 DIAGNOSIS — L820 Inflamed seborrheic keratosis: Secondary | ICD-10-CM | POA: Diagnosis not present

## 2017-06-29 DIAGNOSIS — Z08 Encounter for follow-up examination after completed treatment for malignant neoplasm: Secondary | ICD-10-CM | POA: Diagnosis not present

## 2017-06-29 DIAGNOSIS — C44622 Squamous cell carcinoma of skin of right upper limb, including shoulder: Secondary | ICD-10-CM | POA: Diagnosis not present

## 2017-06-29 DIAGNOSIS — X32XXXA Exposure to sunlight, initial encounter: Secondary | ICD-10-CM | POA: Diagnosis not present

## 2017-07-04 ENCOUNTER — Ambulatory Visit (INDEPENDENT_AMBULATORY_CARE_PROVIDER_SITE_OTHER): Payer: PPO | Admitting: *Deleted

## 2017-07-04 DIAGNOSIS — R001 Bradycardia, unspecified: Secondary | ICD-10-CM | POA: Diagnosis not present

## 2017-07-04 NOTE — Progress Notes (Signed)
Remote pacemaker transmission.   

## 2017-07-05 LAB — CUP PACEART REMOTE DEVICE CHECK
Battery Remaining Longevity: 110 mo
Date Time Interrogation Session: 20180919115455
Implantable Lead Model: 5076
Lead Channel Impedance Value: 0 Ohm
Lead Channel Impedance Value: 609 Ohm
Lead Channel Pacing Threshold Amplitude: 0.375 V
Lead Channel Setting Pacing Amplitude: 2 V
MDC IDC LEAD IMPLANT DT: 20170905
MDC IDC LEAD LOCATION: 753860
MDC IDC MSMT BATTERY IMPEDANCE: 159 Ohm
MDC IDC MSMT BATTERY VOLTAGE: 2.78 V
MDC IDC MSMT LEADCHNL RV PACING THRESHOLD PULSEWIDTH: 0.4 ms
MDC IDC PG IMPLANT DT: 20170905
MDC IDC SET LEADCHNL RV PACING PULSEWIDTH: 0.4 ms
MDC IDC SET LEADCHNL RV SENSING SENSITIVITY: 5.6 mV
MDC IDC STAT BRADY RV PERCENT PACED: 98 %

## 2017-07-06 ENCOUNTER — Encounter: Payer: Self-pay | Admitting: Cardiology

## 2017-08-07 ENCOUNTER — Telehealth: Payer: Self-pay | Admitting: Cardiovascular Disease

## 2017-08-07 NOTE — Telephone Encounter (Signed)
New message    Verdie Drown from [facility] [phone] calling to request retro auth be done for coumadin visit on 06/28/17.

## 2017-08-07 NOTE — Telephone Encounter (Signed)
Returned call to [facility] with [place] office closed.

## 2017-08-08 NOTE — Telephone Encounter (Signed)
Suanne Marker - I believe this is another one you can't do anything about.

## 2017-08-08 NOTE — Telephone Encounter (Signed)
I will look into it and make needed corrections.

## 2017-08-08 NOTE — Telephone Encounter (Signed)
LMTCB for [facility] utilization management

## 2017-08-10 ENCOUNTER — Ambulatory Visit (INDEPENDENT_AMBULATORY_CARE_PROVIDER_SITE_OTHER): Payer: PPO | Admitting: Pharmacist Clinician (PhC)/ Clinical Pharmacy Specialist

## 2017-08-10 DIAGNOSIS — Z7901 Long term (current) use of anticoagulants: Secondary | ICD-10-CM

## 2017-08-10 DIAGNOSIS — I4891 Unspecified atrial fibrillation: Secondary | ICD-10-CM | POA: Diagnosis not present

## 2017-08-10 LAB — POCT INR: INR: 3.6

## 2017-08-13 DIAGNOSIS — L820 Inflamed seborrheic keratosis: Secondary | ICD-10-CM | POA: Diagnosis not present

## 2017-08-13 DIAGNOSIS — D485 Neoplasm of uncertain behavior of skin: Secondary | ICD-10-CM | POA: Diagnosis not present

## 2017-08-24 ENCOUNTER — Ambulatory Visit (INDEPENDENT_AMBULATORY_CARE_PROVIDER_SITE_OTHER): Payer: PPO | Admitting: Pharmacist

## 2017-08-24 DIAGNOSIS — I4891 Unspecified atrial fibrillation: Secondary | ICD-10-CM

## 2017-08-24 DIAGNOSIS — Z7901 Long term (current) use of anticoagulants: Secondary | ICD-10-CM

## 2017-08-24 LAB — POCT INR: INR: 2.5

## 2017-08-27 DIAGNOSIS — L905 Scar conditions and fibrosis of skin: Secondary | ICD-10-CM | POA: Diagnosis not present

## 2017-08-27 DIAGNOSIS — C4442 Squamous cell carcinoma of skin of scalp and neck: Secondary | ICD-10-CM | POA: Diagnosis not present

## 2017-09-10 ENCOUNTER — Telehealth: Payer: Self-pay | Admitting: Cardiovascular Disease

## 2017-09-10 MED ORDER — WARFARIN SODIUM 5 MG PO TABS: ORAL_TABLET | ORAL | 1 refills | Status: DC

## 2017-09-10 NOTE — Telephone Encounter (Signed)
New message   Patient calling for refill on warfarin (COUMADIN) 5 MG tablet.   If Home Health RN is calling please get Coumadin Nurse on the phone STAT  1.  Are you calling in regards to an appointment? NO  2.  Are you calling for a refill ? YES  3.  Are you having bleeding issues? NO  4.  Do you need clearance to hold Coumadin? NO   Please route to the Coumadin Clinic Pool

## 2017-09-21 ENCOUNTER — Ambulatory Visit (INDEPENDENT_AMBULATORY_CARE_PROVIDER_SITE_OTHER): Payer: PPO | Admitting: Pharmacist

## 2017-09-21 DIAGNOSIS — I4891 Unspecified atrial fibrillation: Secondary | ICD-10-CM

## 2017-09-21 DIAGNOSIS — Z7901 Long term (current) use of anticoagulants: Secondary | ICD-10-CM

## 2017-09-21 LAB — POCT INR: INR: 3

## 2017-10-03 ENCOUNTER — Ambulatory Visit (INDEPENDENT_AMBULATORY_CARE_PROVIDER_SITE_OTHER): Payer: PPO | Admitting: *Deleted

## 2017-10-03 DIAGNOSIS — R001 Bradycardia, unspecified: Secondary | ICD-10-CM | POA: Diagnosis not present

## 2017-10-03 NOTE — Progress Notes (Signed)
Remote pacemaker transmission.   

## 2017-10-04 ENCOUNTER — Encounter: Payer: Self-pay | Admitting: Cardiology

## 2017-10-13 LAB — CUP PACEART REMOTE DEVICE CHECK
Battery Impedance: 183 Ohm
Date Time Interrogation Session: 20181219131654
Implantable Lead Implant Date: 20170905
Implantable Pulse Generator Implant Date: 20170905
Lead Channel Impedance Value: 0 Ohm
Lead Channel Impedance Value: 595 Ohm
Lead Channel Pacing Threshold Pulse Width: 0.4 ms
Lead Channel Setting Sensing Sensitivity: 5.6 mV
MDC IDC LEAD LOCATION: 753860
MDC IDC MSMT BATTERY REMAINING LONGEVITY: 107 mo
MDC IDC MSMT BATTERY VOLTAGE: 2.78 V
MDC IDC MSMT LEADCHNL RV PACING THRESHOLD AMPLITUDE: 0.5 V
MDC IDC SET LEADCHNL RV PACING AMPLITUDE: 2 V
MDC IDC SET LEADCHNL RV PACING PULSEWIDTH: 0.4 ms
MDC IDC STAT BRADY RV PERCENT PACED: 98 %

## 2017-10-22 ENCOUNTER — Encounter: Payer: Self-pay | Admitting: Cardiovascular Disease

## 2017-10-22 ENCOUNTER — Ambulatory Visit (INDEPENDENT_AMBULATORY_CARE_PROVIDER_SITE_OTHER): Payer: PPO | Admitting: Pharmacist

## 2017-10-22 ENCOUNTER — Ambulatory Visit: Payer: PPO | Admitting: Cardiovascular Disease

## 2017-10-22 VITALS — BP 122/64 | HR 85 | Ht 69.0 in | Wt 191.0 lb

## 2017-10-22 DIAGNOSIS — Z7901 Long term (current) use of anticoagulants: Secondary | ICD-10-CM

## 2017-10-22 DIAGNOSIS — I10 Essential (primary) hypertension: Secondary | ICD-10-CM | POA: Diagnosis not present

## 2017-10-22 DIAGNOSIS — E7800 Pure hypercholesterolemia, unspecified: Secondary | ICD-10-CM | POA: Diagnosis not present

## 2017-10-22 DIAGNOSIS — I2510 Atherosclerotic heart disease of native coronary artery without angina pectoris: Secondary | ICD-10-CM

## 2017-10-22 DIAGNOSIS — R001 Bradycardia, unspecified: Secondary | ICD-10-CM | POA: Diagnosis not present

## 2017-10-22 DIAGNOSIS — I4891 Unspecified atrial fibrillation: Secondary | ICD-10-CM

## 2017-10-22 DIAGNOSIS — Z950 Presence of cardiac pacemaker: Secondary | ICD-10-CM

## 2017-10-22 DIAGNOSIS — E118 Type 2 diabetes mellitus with unspecified complications: Secondary | ICD-10-CM | POA: Diagnosis not present

## 2017-10-22 LAB — POCT INR: INR: 2.1

## 2017-10-22 NOTE — Progress Notes (Signed)
Patient ID: Steve Lloyd, male   DOB: 1930-01-15, 84 y.o.   MRN: [id number]    Cardiology Office Note    Date:  10/22/2017   ID:  Steve Lloyd, DOB 1930-08-01, MRN [id number]  PCP:  Steve Pink, MD  Cardiologist:   Steve Klein, MD   Chief Complaint  Patient presents with  . Follow-up    12 months  CAD s/p CABG  History of Present Illness:  Steve Lloyd is a 84 y.o. male with CAD s/p coronary bypass surgery in 1998 (single-vessel LIMA to LAD) and has not had recurrent coronary problems since that time.   He has permanent atrial fibrillation with slow ventricular response, on warfarin anticoagulation, s/p single-chamber permanent pacemaker (Medtronic [place] [id number]) in September 2017.  Mr. Hsiao generally feels well.  He would like a little more stamina, but is actually quite active for an 83 year old man.  The patient specifically denies any chest pain at rest exertion, dyspnea at rest or with exertion, orthopnea, paroxysmal nocturnal dyspnea, syncope, palpitations, focal neurological deficits, intermittent claudication, lower extremity edema, unexplained weight gain, cough, hemoptysis or wheezing.  Device interrogation shows normal function. The device is programmed VVIR with a lower rate limit of 60 bpm.  Estimated generator longevity is 9 years.  He has 98% ventricular pacing.  The heart rate histogram distribution appears favorable.  He has only had 2 episodes of high ventricular rate in the last 15 months since device implantation.  Lead parameters are excellent.  He is overweight, has diabetes mellitus on oral medications. He takes a statin for hyperlipidemia.   Past Medical History:  Diagnosis Date  . Atrial fibrillation with slow ventricular response ([place])    hx/notes 06/20/2016  . Coronary artery disease   . Hyperlipidemia   . Kidney stones   . Pneumonia ~ 1950  . Presence of permanent cardiac pacemaker   . S/P cardiac pacemaker procedure, [employer]  06/20/16  06/21/2016  . Type II diabetes mellitus ([place])     Past Surgical History:  Procedure Laterality Date  . APPENDECTOMY    . CATARACT EXTRACTION W/ INTRAOCULAR LENS  IMPLANT, BILATERAL Bilateral   . CORONARY ARTERY BYPASS GRAFT  1998   single-vessel LIMA to LAD/notes 06/20/2016  . EP IMPLANTABLE DEVICE N/A 06/20/2016   Procedure: Pacemaker Implant;  Surgeon: Steve Klein, MD;  Location: [facility];  Service: Cardiovascular;  Laterality: N/A;  . INGUINAL HERNIA REPAIR Right   . INSERT / REPLACE / REMOVE PACEMAKER  06/20/2016    Outpatient Medications Prior to Visit  Medication Sig Dispense Refill  . acetaminophen (TYLENOL) 325 MG tablet Take 1-2 tablets (325-650 mg total) by mouth every 4 (four) hours as needed for mild pain.    Marland Kitchen amLODipine (NORVASC) 5 MG tablet Take 5 mg by mouth daily.    Marland Kitchen aspirin 81 MG tablet Take 81 mg by mouth daily.    Marland Kitchen atorvastatin (LIPITOR) 10 MG tablet Take 10 mg by mouth daily.    . benazepril (LOTENSIN) 10 MG tablet Take 10 mg by mouth daily.    . Cyanocobalamin (VITAMIN B-12 IJ) Inject 1 mg as directed every 30 (thirty) days.     . Multiple Vitamin (MULTIVITAMIN) tablet Take 1 tablet by mouth daily.    . pioglitazone-metformin (ACTOPLUS MET) 15-500 MG tablet TAKE 1 TABLET BY MOUTH 2 (TWO) TIMES DAILY WITH MEALS.  1  . warfarin (COUMADIN) 5 MG tablet TAKE 1 TABLET BY MOUTH DAILY OR AS DIRECTED 90 tablet  1   No facility-administered medications prior to visit.      Allergies:   Patient has no known allergies.   Social History   Socioeconomic History  . Marital status: Married    Spouse name: None  . Number of children: None  . Years of education: None  . Highest education level: None  Social Needs  . Financial resource strain: None  . Food insecurity - worry: None  . Food insecurity - inability: None  . Transportation needs - medical: None  . Transportation needs - non-medical: None  Occupational History  . None  Tobacco Use  .  Smoking status: Former Smoker    Packs/day: 1.00    Years: 8.00    Pack years: 8.00    Last attempt to quit: 10/15/1956    Years since quitting: 61.0  . Smokeless tobacco: Never Used  Substance and Sexual Activity  . Alcohol use: Yes    Alcohol/week: 8.4 oz    Types: 14 Glasses of wine per week    Comment: 06/20/2016 "2 glasses of wine q night"  . Drug use: No  . Sexual activity: None  Other Topics Concern  . None  Social History Narrative  . None     ROS:   Please see the history of present illness.    ROS All other systems reviewed and are negative.   PHYSICAL EXAM:   VS:  BP 122/64   Pulse 85   Ht 5' 9"  (1.753 m)   Wt 191 lb (86.6 kg)   BMI 28.21 kg/m     General: Alert, oriented x3, no distress, moderately overweight Head: no evidence of trauma, PERRL, EOMI, no exophtalmos or lid lag, no myxedema, no xanthelasma; normal ears, nose and oropharynx Neck: normal jugular venous pulsations and no hepatojugular reflux; brisk carotid pulses without delay and no carotid bruits Chest: clear to auscultation, no signs of consolidation by percussion or palpation, normal fremitus, symmetrical and full respiratory excursions.  Healthy left subclavian pacemaker site  Cardiovascular: normal position and quality of the apical impulse, regular rhythm, normal first and paradoxically split second heart sounds, no murmurs, rubs or gallops Abdomen: no tenderness or distention, no masses by palpation, no abnormal pulsatility or arterial bruits, normal bowel sounds, no hepatosplenomegaly Extremities: no clubbing, cyanosis or edema; 2+ radial, ulnar and brachial pulses bilaterally; 2+ right femoral, posterior tibial and dorsalis pedis pulses; 2+ left femoral, posterior tibial and dorsalis pedis pulses; no subclavian or femoral bruits Neurological: grossly nonfocal Psych: Normal mood and affect   Wt Readings from Last 3 Encounters:  10/22/17 191 lb (86.6 kg)  09/25/16 193 lb 9.6 oz (87.8 kg)    06/20/16 195 lb (88.5 kg)      Studies/Labs Reviewed:   EKG:  EKG is ordered today.  100% ventricular pacing on background of atrial fibrillation Recent Labs: June 2018  Total cholesterol 130, triglycerides 97, HDL 59, LDL 51,  hemoglobin A1c 5.9% Hemoglobin 13.1, normal LFTs, creatinine 1.1  ASSESSMENT:    1. Atrial fibrillation with slow ventricular response ([place])   2. S/P cardiac pacemaker procedure   3. Coronary artery disease involving native coronary artery of native heart without angina pectoris   4. Essential hypertension   5. Pure hypercholesterolemia   6. Long term current use of anticoagulant therapy   7. Type 2 diabetes mellitus with complication, without long-term current use of insulin (HCC)      PLAN:  In order of problems listed above:  1. Atrial fibrillation  with slow ventricular response.  Has virtually 100% ventricular pacing.  Appropriate heart rate histogram distribution, asymptomatic  2. PPM: Normal device function.  Remote check every 3 months.  Yearly office visits 3. CAD s/p CABG: Does not have angina despite an active lifestyle.  Asymptomatic. 4. HTN: Excellent control 5. HLP: All lipid parameters within target range on statin 6. Warfarin. CHADSVasc 4 (age 87, CAD, HTN).  Without bleeding or embolic complications. INR checked today 7. DM: Excellent glycemic control     Medication Adjustments/Labs and Tests Ordered: Current medicines are reviewed at length with the patient today.  Concerns regarding medicines are outlined above.  Medication changes, Labs and Tests ordered today are listed in the Patient Instructions below. Patient Instructions  Dr Sallyanne Kuster recommends that you continue on your current medications as directed. Please refer to the Current Medication list given to you today.  Remote monitoring is used to monitor your Pacemaker or ICD from home. This monitoring reduces the number of office visits required to check your device to one  time per year. It allows [place] to keep an eye on the functioning of your device to ensure it is working properly. You are scheduled for a device check from home on Wednesday, March 20th, 2019. You may send your transmission at any time that day. If you have a wireless device, the transmission will be sent automatically. After your physician reviews your transmission, you will receive a notification with your next transmission date.  Dr Sallyanne Kuster recommends that you schedule a follow-up appointment in 12 months with a pacemaker check. You will receive a reminder letter in the mail two months in advance. If you don't receive a letter, please call our office to schedule the follow-up appointment.  If you need a refill on your cardiac medications before your next appointment, please call your pharmacy.      Signed, Steve Klein, MD  10/22/2017 12:54 PM    [facility] [address], [place], [place]  [address] Phone: [phone]; Fax: [phone]

## 2017-10-22 NOTE — Patient Instructions (Signed)

## 2017-11-26 LAB — CUP PACEART INCLINIC DEVICE CHECK
Battery Impedance: 183 Ohm
Battery Voltage: 2.77 V
Date Time Interrogation Session: 20190107133436
Implantable Lead Location: 753860
Lead Channel Impedance Value: 0 Ohm
Lead Channel Impedance Value: 582 Ohm
Lead Channel Setting Pacing Amplitude: 2 V
Lead Channel Setting Sensing Sensitivity: 5.6 mV
MDC IDC LEAD IMPLANT DT: 20170905
MDC IDC MSMT BATTERY REMAINING LONGEVITY: 107 mo
MDC IDC MSMT LEADCHNL RV PACING THRESHOLD AMPLITUDE: 0.5 V
MDC IDC MSMT LEADCHNL RV PACING THRESHOLD PULSEWIDTH: 0.4 ms
MDC IDC PG IMPLANT DT: 20170905
MDC IDC SET LEADCHNL RV PACING PULSEWIDTH: 0.4 ms
MDC IDC STAT BRADY RV PERCENT PACED: 98 %

## 2017-11-27 DIAGNOSIS — D225 Melanocytic nevi of trunk: Secondary | ICD-10-CM | POA: Diagnosis not present

## 2017-11-27 DIAGNOSIS — D2261 Melanocytic nevi of right upper limb, including shoulder: Secondary | ICD-10-CM | POA: Diagnosis not present

## 2017-11-27 DIAGNOSIS — L570 Actinic keratosis: Secondary | ICD-10-CM | POA: Diagnosis not present

## 2017-11-27 DIAGNOSIS — X32XXXA Exposure to sunlight, initial encounter: Secondary | ICD-10-CM | POA: Diagnosis not present

## 2017-11-27 DIAGNOSIS — Z85828 Personal history of other malignant neoplasm of skin: Secondary | ICD-10-CM | POA: Diagnosis not present

## 2017-11-27 DIAGNOSIS — D2272 Melanocytic nevi of left lower limb, including hip: Secondary | ICD-10-CM | POA: Diagnosis not present

## 2017-12-03 ENCOUNTER — Ambulatory Visit (INDEPENDENT_AMBULATORY_CARE_PROVIDER_SITE_OTHER): Payer: PPO | Admitting: Pharmacist

## 2017-12-03 DIAGNOSIS — Z7901 Long term (current) use of anticoagulants: Secondary | ICD-10-CM | POA: Diagnosis not present

## 2017-12-03 DIAGNOSIS — I4891 Unspecified atrial fibrillation: Secondary | ICD-10-CM | POA: Diagnosis not present

## 2017-12-03 LAB — POCT INR: INR: 2.1

## 2017-12-27 DIAGNOSIS — H47391 Other disorders of optic disc, right eye: Secondary | ICD-10-CM | POA: Diagnosis not present

## 2018-01-02 ENCOUNTER — Ambulatory Visit (INDEPENDENT_AMBULATORY_CARE_PROVIDER_SITE_OTHER): Payer: PPO | Admitting: *Deleted

## 2018-01-02 DIAGNOSIS — R001 Bradycardia, unspecified: Secondary | ICD-10-CM

## 2018-01-02 NOTE — Progress Notes (Signed)
Remote pacemaker transmission.   

## 2018-01-03 ENCOUNTER — Encounter: Payer: Self-pay | Admitting: Cardiology

## 2018-01-14 ENCOUNTER — Ambulatory Visit (INDEPENDENT_AMBULATORY_CARE_PROVIDER_SITE_OTHER): Payer: PPO | Admitting: Pharmacist

## 2018-01-14 DIAGNOSIS — I4891 Unspecified atrial fibrillation: Secondary | ICD-10-CM

## 2018-01-14 DIAGNOSIS — Z7901 Long term (current) use of anticoagulants: Secondary | ICD-10-CM

## 2018-01-14 LAB — POCT INR: INR: 2.7

## 2018-01-25 LAB — CUP PACEART REMOTE DEVICE CHECK
Date Time Interrogation Session: 20190320120756
Implantable Lead Implant Date: 20170905
Implantable Pulse Generator Implant Date: 20170905
Lead Channel Impedance Value: 0 Ohm
Lead Channel Impedance Value: 572 Ohm
Lead Channel Pacing Threshold Pulse Width: 0.4 ms
Lead Channel Setting Pacing Amplitude: 2 V
MDC IDC LEAD LOCATION: 753860
MDC IDC MSMT BATTERY IMPEDANCE: 208 Ohm
MDC IDC MSMT BATTERY REMAINING LONGEVITY: 105 mo
MDC IDC MSMT BATTERY VOLTAGE: 2.78 V
MDC IDC MSMT LEADCHNL RV PACING THRESHOLD AMPLITUDE: 0.5 V
MDC IDC SET LEADCHNL RV PACING PULSEWIDTH: 0.4 ms
MDC IDC SET LEADCHNL RV SENSING SENSITIVITY: 5.6 mV
MDC IDC STAT BRADY RV PERCENT PACED: 98 %

## 2018-02-25 ENCOUNTER — Ambulatory Visit (INDEPENDENT_AMBULATORY_CARE_PROVIDER_SITE_OTHER): Payer: PPO | Admitting: Pharmacist

## 2018-02-25 DIAGNOSIS — Z7901 Long term (current) use of anticoagulants: Secondary | ICD-10-CM | POA: Diagnosis not present

## 2018-02-25 DIAGNOSIS — I4891 Unspecified atrial fibrillation: Secondary | ICD-10-CM | POA: Diagnosis not present

## 2018-02-25 LAB — POCT INR: INR: 2.4

## 2018-03-16 ENCOUNTER — Other Ambulatory Visit: Payer: Self-pay | Admitting: Cardiovascular Disease

## 2018-04-03 ENCOUNTER — Ambulatory Visit (INDEPENDENT_AMBULATORY_CARE_PROVIDER_SITE_OTHER): Payer: PPO | Admitting: *Deleted

## 2018-04-03 DIAGNOSIS — I4891 Unspecified atrial fibrillation: Secondary | ICD-10-CM | POA: Diagnosis not present

## 2018-04-03 DIAGNOSIS — R001 Bradycardia, unspecified: Secondary | ICD-10-CM

## 2018-04-03 NOTE — Progress Notes (Signed)
Remote pacemaker transmission.   

## 2018-04-05 LAB — CUP PACEART REMOTE DEVICE CHECK
Battery Remaining Longevity: 102 mo
Date Time Interrogation Session: 20190619110345
Implantable Lead Implant Date: 20170905
Implantable Lead Model: 5076
Implantable Pulse Generator Implant Date: 20170905
Lead Channel Pacing Threshold Amplitude: 0.5 V
Lead Channel Pacing Threshold Pulse Width: 0.4 ms
MDC IDC LEAD LOCATION: 753860
MDC IDC MSMT BATTERY IMPEDANCE: 232 Ohm
MDC IDC MSMT BATTERY VOLTAGE: 2.78 V
MDC IDC MSMT LEADCHNL RA IMPEDANCE VALUE: 0 Ohm
MDC IDC MSMT LEADCHNL RV IMPEDANCE VALUE: 547 Ohm
MDC IDC SET LEADCHNL RV PACING AMPLITUDE: 2 V
MDC IDC SET LEADCHNL RV PACING PULSEWIDTH: 0.4 ms
MDC IDC SET LEADCHNL RV SENSING SENSITIVITY: 4 mV
MDC IDC STAT BRADY RV PERCENT PACED: 98 %

## 2018-04-08 ENCOUNTER — Ambulatory Visit (INDEPENDENT_AMBULATORY_CARE_PROVIDER_SITE_OTHER): Payer: PPO | Admitting: Pharmacist

## 2018-04-08 DIAGNOSIS — I4891 Unspecified atrial fibrillation: Secondary | ICD-10-CM

## 2018-04-08 DIAGNOSIS — Z7901 Long term (current) use of anticoagulants: Secondary | ICD-10-CM

## 2018-04-08 LAB — POCT INR: INR: 2.9 (ref 2.0–3.0)

## 2018-04-25 DIAGNOSIS — Z23 Encounter for immunization: Secondary | ICD-10-CM | POA: Diagnosis not present

## 2018-04-25 DIAGNOSIS — I10 Essential (primary) hypertension: Secondary | ICD-10-CM | POA: Diagnosis not present

## 2018-04-25 DIAGNOSIS — Z125 Encounter for screening for malignant neoplasm of prostate: Secondary | ICD-10-CM | POA: Diagnosis not present

## 2018-04-25 DIAGNOSIS — E119 Type 2 diabetes mellitus without complications: Secondary | ICD-10-CM | POA: Diagnosis not present

## 2018-04-25 DIAGNOSIS — D649 Anemia, unspecified: Secondary | ICD-10-CM | POA: Diagnosis not present

## 2018-04-25 DIAGNOSIS — I4891 Unspecified atrial fibrillation: Secondary | ICD-10-CM | POA: Diagnosis not present

## 2018-04-25 DIAGNOSIS — E785 Hyperlipidemia, unspecified: Secondary | ICD-10-CM | POA: Diagnosis not present

## 2018-04-25 DIAGNOSIS — Z79899 Other long term (current) drug therapy: Secondary | ICD-10-CM | POA: Diagnosis not present

## 2018-04-25 DIAGNOSIS — Z0000 Encounter for general adult medical examination without abnormal findings: Secondary | ICD-10-CM | POA: Diagnosis not present

## 2018-05-07 DIAGNOSIS — D649 Anemia, unspecified: Secondary | ICD-10-CM | POA: Diagnosis not present

## 2018-05-20 ENCOUNTER — Ambulatory Visit: Payer: PPO | Admitting: Pharmacist

## 2018-05-20 DIAGNOSIS — Z7901 Long term (current) use of anticoagulants: Secondary | ICD-10-CM | POA: Diagnosis not present

## 2018-05-20 DIAGNOSIS — I4891 Unspecified atrial fibrillation: Secondary | ICD-10-CM | POA: Diagnosis not present

## 2018-05-20 LAB — POCT INR: INR: 2.8 (ref 2.0–3.0)

## 2018-05-23 DIAGNOSIS — I482 Chronic atrial fibrillation: Secondary | ICD-10-CM | POA: Diagnosis not present

## 2018-05-23 DIAGNOSIS — Z950 Presence of cardiac pacemaker: Secondary | ICD-10-CM | POA: Diagnosis not present

## 2018-05-23 DIAGNOSIS — R195 Other fecal abnormalities: Secondary | ICD-10-CM | POA: Diagnosis not present

## 2018-06-14 DIAGNOSIS — R972 Elevated prostate specific antigen [PSA]: Secondary | ICD-10-CM | POA: Diagnosis not present

## 2018-06-21 DIAGNOSIS — N401 Enlarged prostate with lower urinary tract symptoms: Secondary | ICD-10-CM | POA: Diagnosis not present

## 2018-06-21 DIAGNOSIS — R3915 Urgency of urination: Secondary | ICD-10-CM | POA: Diagnosis not present

## 2018-06-21 DIAGNOSIS — R972 Elevated prostate specific antigen [PSA]: Secondary | ICD-10-CM | POA: Diagnosis not present

## 2018-07-01 ENCOUNTER — Ambulatory Visit (INDEPENDENT_AMBULATORY_CARE_PROVIDER_SITE_OTHER): Payer: PPO | Admitting: Pharmacist

## 2018-07-01 DIAGNOSIS — H47391 Other disorders of optic disc, right eye: Secondary | ICD-10-CM | POA: Diagnosis not present

## 2018-07-01 DIAGNOSIS — I4891 Unspecified atrial fibrillation: Secondary | ICD-10-CM | POA: Diagnosis not present

## 2018-07-01 DIAGNOSIS — Z7901 Long term (current) use of anticoagulants: Secondary | ICD-10-CM | POA: Diagnosis not present

## 2018-07-01 LAB — POCT INR: INR: 2.5 (ref 2.0–3.0)

## 2018-07-03 ENCOUNTER — Ambulatory Visit (INDEPENDENT_AMBULATORY_CARE_PROVIDER_SITE_OTHER): Payer: PPO | Admitting: *Deleted

## 2018-07-03 DIAGNOSIS — R001 Bradycardia, unspecified: Secondary | ICD-10-CM | POA: Diagnosis not present

## 2018-07-03 DIAGNOSIS — I4891 Unspecified atrial fibrillation: Secondary | ICD-10-CM

## 2018-07-03 NOTE — Progress Notes (Signed)
Remote pacemaker transmission.   

## 2018-07-26 LAB — CUP PACEART REMOTE DEVICE CHECK
Battery Impedance: 305 Ohm
Battery Remaining Longevity: 96 mo
Battery Voltage: 2.77 V
Brady Statistic RV Percent Paced: 99 %
Date Time Interrogation Session: 20190918113451
Implantable Lead Model: 5076
Lead Channel Pacing Threshold Amplitude: 0.5 V
Lead Channel Setting Pacing Amplitude: 2 V
Lead Channel Setting Pacing Pulse Width: 0.4 ms
Lead Channel Setting Sensing Sensitivity: 4 mV
MDC IDC LEAD IMPLANT DT: 20170905
MDC IDC LEAD LOCATION: 753860
MDC IDC MSMT LEADCHNL RA IMPEDANCE VALUE: 0 Ohm
MDC IDC MSMT LEADCHNL RV IMPEDANCE VALUE: 505 Ohm
MDC IDC MSMT LEADCHNL RV PACING THRESHOLD PULSEWIDTH: 0.4 ms
MDC IDC PG IMPLANT DT: 20170905

## 2018-08-12 ENCOUNTER — Ambulatory Visit: Payer: PPO | Admitting: Pharmacist

## 2018-08-12 DIAGNOSIS — Z7901 Long term (current) use of anticoagulants: Secondary | ICD-10-CM

## 2018-08-12 DIAGNOSIS — I4891 Unspecified atrial fibrillation: Secondary | ICD-10-CM

## 2018-08-12 LAB — POCT INR: INR: 2.3 (ref 2.0–3.0)

## 2018-08-14 DIAGNOSIS — Z08 Encounter for follow-up examination after completed treatment for malignant neoplasm: Secondary | ICD-10-CM | POA: Diagnosis not present

## 2018-08-14 DIAGNOSIS — D2262 Melanocytic nevi of left upper limb, including shoulder: Secondary | ICD-10-CM | POA: Diagnosis not present

## 2018-08-14 DIAGNOSIS — D2261 Melanocytic nevi of right upper limb, including shoulder: Secondary | ICD-10-CM | POA: Diagnosis not present

## 2018-08-14 DIAGNOSIS — D225 Melanocytic nevi of trunk: Secondary | ICD-10-CM | POA: Diagnosis not present

## 2018-08-14 DIAGNOSIS — L570 Actinic keratosis: Secondary | ICD-10-CM | POA: Diagnosis not present

## 2018-08-14 DIAGNOSIS — Z85828 Personal history of other malignant neoplasm of skin: Secondary | ICD-10-CM | POA: Diagnosis not present

## 2018-08-14 DIAGNOSIS — D2272 Melanocytic nevi of left lower limb, including hip: Secondary | ICD-10-CM | POA: Diagnosis not present

## 2018-08-14 DIAGNOSIS — X32XXXA Exposure to sunlight, initial encounter: Secondary | ICD-10-CM | POA: Diagnosis not present

## 2018-08-14 DIAGNOSIS — D2271 Melanocytic nevi of right lower limb, including hip: Secondary | ICD-10-CM | POA: Diagnosis not present

## 2018-08-14 DIAGNOSIS — D692 Other nonthrombocytopenic purpura: Secondary | ICD-10-CM | POA: Diagnosis not present

## 2018-08-19 DIAGNOSIS — R413 Other amnesia: Secondary | ICD-10-CM | POA: Diagnosis not present

## 2018-09-11 ENCOUNTER — Other Ambulatory Visit: Payer: Self-pay | Admitting: Cardiovascular Disease

## 2018-09-23 ENCOUNTER — Ambulatory Visit (INDEPENDENT_AMBULATORY_CARE_PROVIDER_SITE_OTHER): Payer: PPO | Admitting: Pharmacist Clinician (PhC)/ Clinical Pharmacy Specialist

## 2018-09-23 DIAGNOSIS — I4891 Unspecified atrial fibrillation: Secondary | ICD-10-CM | POA: Diagnosis not present

## 2018-09-23 DIAGNOSIS — Z7901 Long term (current) use of anticoagulants: Secondary | ICD-10-CM | POA: Diagnosis not present

## 2018-09-23 LAB — POCT INR: INR: 4.7 — AB (ref 2.0–3.0)

## 2018-09-23 NOTE — Patient Instructions (Signed)
Description   No warfarin Monday Dec 9 or Tuesday Dec 10, then continue taking 1/2 tablet daily except 1 tablet each Sunday, Tuesday and Thursday.  Repeat INR in 2 weeks

## 2018-10-02 ENCOUNTER — Telehealth: Payer: Self-pay

## 2018-10-02 NOTE — Telephone Encounter (Signed)
Spoke with pt and reminded pt of remote transmission that is due today. Pt verbalized understanding.   

## 2018-10-03 ENCOUNTER — Encounter: Payer: Self-pay | Admitting: Cardiology

## 2018-10-07 ENCOUNTER — Ambulatory Visit (INDEPENDENT_AMBULATORY_CARE_PROVIDER_SITE_OTHER): Payer: PPO | Admitting: Pharmacist

## 2018-10-07 DIAGNOSIS — Z7901 Long term (current) use of anticoagulants: Secondary | ICD-10-CM | POA: Diagnosis not present

## 2018-10-07 DIAGNOSIS — I4891 Unspecified atrial fibrillation: Secondary | ICD-10-CM | POA: Diagnosis not present

## 2018-10-07 LAB — POCT INR: INR: 4.5 — AB (ref 2.0–3.0)

## 2018-10-21 ENCOUNTER — Ambulatory Visit (INDEPENDENT_AMBULATORY_CARE_PROVIDER_SITE_OTHER): Payer: PPO | Admitting: Pharmacist Clinician (PhC)/ Clinical Pharmacy Specialist

## 2018-10-21 DIAGNOSIS — Z7901 Long term (current) use of anticoagulants: Secondary | ICD-10-CM | POA: Diagnosis not present

## 2018-10-21 DIAGNOSIS — I4891 Unspecified atrial fibrillation: Secondary | ICD-10-CM | POA: Diagnosis not present

## 2018-10-21 LAB — POCT INR: INR: 3.8 — AB (ref 2.0–3.0)

## 2018-10-21 NOTE — Patient Instructions (Signed)
Description   HOLD warfarin dose tomorrow (already taken today), then decrease dose to 2.5mg  daily.  Repeat INR in 2 weeks

## 2018-10-31 DIAGNOSIS — E119 Type 2 diabetes mellitus without complications: Secondary | ICD-10-CM | POA: Diagnosis not present

## 2018-11-06 ENCOUNTER — Telehealth: Payer: Self-pay

## 2018-11-06 ENCOUNTER — Ambulatory Visit: Payer: PPO | Admitting: Cardiovascular Disease

## 2018-11-06 ENCOUNTER — Ambulatory Visit: Payer: PPO | Admitting: Pharmacist

## 2018-11-06 ENCOUNTER — Encounter (INDEPENDENT_AMBULATORY_CARE_PROVIDER_SITE_OTHER): Payer: Self-pay

## 2018-11-06 ENCOUNTER — Encounter: Payer: Self-pay | Admitting: Cardiovascular Disease

## 2018-11-06 VITALS — BP 132/62 | HR 85 | Ht 68.0 in | Wt 177.4 lb

## 2018-11-06 DIAGNOSIS — Z950 Presence of cardiac pacemaker: Secondary | ICD-10-CM | POA: Diagnosis not present

## 2018-11-06 DIAGNOSIS — I4891 Unspecified atrial fibrillation: Secondary | ICD-10-CM

## 2018-11-06 DIAGNOSIS — Z7901 Long term (current) use of anticoagulants: Secondary | ICD-10-CM

## 2018-11-06 DIAGNOSIS — E7800 Pure hypercholesterolemia, unspecified: Secondary | ICD-10-CM | POA: Diagnosis not present

## 2018-11-06 DIAGNOSIS — I2510 Atherosclerotic heart disease of native coronary artery without angina pectoris: Secondary | ICD-10-CM | POA: Diagnosis not present

## 2018-11-06 DIAGNOSIS — E119 Type 2 diabetes mellitus without complications: Secondary | ICD-10-CM | POA: Diagnosis not present

## 2018-11-06 DIAGNOSIS — I10 Essential (primary) hypertension: Secondary | ICD-10-CM | POA: Diagnosis not present

## 2018-11-06 LAB — POCT INR: INR: 1.7 — AB (ref 2.0–3.0)

## 2018-11-06 NOTE — Telephone Encounter (Signed)
-----   Message from Lamar Laundry, RN sent at 11/06/2018 10:23 AM EST ----- Regarding: pt needs to be set up for remote home ck This pt was seen by Dr.Croitoru on 11/06/18. He has not been scheduled for at home checks in a while. He does have the equipment at home. Dr.C would like him to have remote checks every 3 months. Could your please contact the patient to arrange.

## 2018-11-06 NOTE — Patient Instructions (Signed)
Medication Instructions:  Your physician recommends that you continue on your current medications as directed. Please refer to the Current Medication list given to you today.  If you need a refill on your cardiac medications before your next appointment, please call your pharmacy.   Lab work: None ordered If you have labs (blood work) drawn today and your tests are completely normal, you will receive your results only by: Marland Kitchen MyChart Message (if you have MyChart) OR . A paper copy in the mail If you have any lab test that is abnormal or we need to change your treatment, we will call you to review the results.  Testing/Procedures: None ordered  Follow-Up: At [facility], you and your health needs are our priority.  As part of our continuing mission to provide you with exceptional heart care, we have created designated Provider Care Teams.  These Care Teams include your primary Cardiologist (physician) and Advanced Practice Providers (APPs -  Physician Assistants and Nurse Practitioners) who all work together to provide you with the care you need, when you need it. You will need a follow up appointment in 12 months.  Please call our office 2 months in advance to schedule this appointment.  You may see Sanda Klein, MD or one of the following Advanced Practice Providers on your designated Care Team: [place], [place] . Fabian Sharp, PA-C  Any Other Special Instructions Will Be Listed Below (If Applicable). The Device clinic will contact your regarding setting up your remote home device checks every 3 months.

## 2018-11-06 NOTE — Telephone Encounter (Signed)
I called the patient to get him to send a manual transmission with his home monitor but we received an error code [id number]. I gave him the number to [place] support to help trouble shoot the monitor. I informed the patient that his next home remote appointment would be 02/05/2019.

## 2018-11-06 NOTE — Progress Notes (Signed)
Patient ID: Steve Lloyd, male   DOB: 06/13/1930, 85 y.o.   MRN: [id number]    Cardiology Office Note    Date:  11/06/2018   ID:  Steve Lloyd, Steve Lloyd [birth year], MRN [id number]  PCP:  Steve Pink, MD  Cardiologist:   Steve Klein, MD   Chief Complaint  Patient presents with  . Atrial Fibrillation  . Pacemaker Check  CAD s/p CABG  History of Present Illness:  Steve Lloyd is a 85 y.o. male with CAD s/p coronary bypass surgery in 1998 (single-vessel LIMA to LAD) and has not had recurrent coronary problems since that time.   He has permanent atrial fibrillation with slow ventricular response, on warfarin anticoagulation, s/p single-chamber permanent pacemaker ([employer] Dalzell [id number]) in September 2017.  Steve Lloyd generally feels well.  The patient specifically denies any chest pain at rest exertion, dyspnea at rest or with exertion, orthopnea, paroxysmal nocturnal dyspnea, syncope, palpitations, focal neurological deficits, intermittent claudication, lower extremity edema, unexplained weight gain, cough, hemoptysis or wheezing.  Device interrogation shows normal function. The device is programmed VVIR with a lower rate limit of 60 bpm.  Estimated generator longevity is 7.5 years.  He has 98.6% ventricular pacing.  The heart rate histogram distribution appears favorable.  He has only had one episode of high ventricular rate in the last year.  Lead parameters are excellent. Underlying ventricular rate is in the 40s - he is not truly device dependent.  He is overweight, has diabetes mellitus on oral medications. He takes a statin for hyperlipidemia. Labs are monitored by PCP.   Past Medical History:  Diagnosis Date  . Atrial fibrillation with slow ventricular response ([place])    hx/notes 06/20/2016  . Coronary artery disease   . Hyperlipidemia   . Kidney stones   . Pneumonia ~ 1950  . Presence of permanent cardiac pacemaker   . S/P cardiac pacemaker procedure, [employer] 06/20/16  06/21/2016  . Type II diabetes mellitus ([place])     Past Surgical History:  Procedure Laterality Date  . APPENDECTOMY    . CATARACT EXTRACTION W/ INTRAOCULAR LENS  IMPLANT, BILATERAL Bilateral   . CORONARY ARTERY BYPASS GRAFT  1998   single-vessel LIMA to LAD/notes 06/20/2016  . EP IMPLANTABLE DEVICE N/A 06/20/2016   Procedure: Pacemaker Implant;  Surgeon: Steve Klein, MD;  Location: [facility];  Service: Cardiovascular;  Laterality: N/A;  . INGUINAL HERNIA REPAIR Right   . INSERT / REPLACE / REMOVE PACEMAKER  06/20/2016    Outpatient Medications Prior to Visit  Medication Sig Dispense Refill  . acetaminophen (TYLENOL) 325 MG tablet Take 1-2 tablets (325-650 mg total) by mouth every 4 (four) hours as needed for mild pain.    Marland Kitchen amLODipine (NORVASC) 5 MG tablet Take 5 mg by mouth daily.    Marland Kitchen atorvastatin (LIPITOR) 10 MG tablet Take 10 mg by mouth daily.    . benazepril (LOTENSIN) 10 MG tablet Take 10 mg by mouth daily.    . Cyanocobalamin (VITAMIN B-12 IJ) Inject 1 mg as directed every 30 (thirty) days.     . Multiple Vitamin (MULTIVITAMIN) tablet Take 1 tablet by mouth daily.    . pioglitazone-metformin (ACTOPLUS MET) 15-500 MG tablet TAKE 1 TABLET BY MOUTH 2 (TWO) TIMES DAILY WITH MEALS.  1  . warfarin (COUMADIN) 5 MG tablet TAKE 1/2 TO 1 TABLET BY MOUTH DAILY OR AS DIRECTED 90 tablet 1  . aspirin 81 MG tablet Take 81 mg by mouth daily.  No facility-administered medications prior to visit.      Allergies:   Patient has no known allergies.   Social History   Socioeconomic History  . Marital status: Married    Spouse name: Not on file  . Number of children: Not on file  . Years of education: Not on file  . Highest education level: Not on file  Occupational History  . Not on file  Social Needs  . Financial resource strain: Not on file  . Food insecurity:    Worry: Not on file    Inability: Not on file  . Transportation needs:    Medical: Not on  file    Non-medical: Not on file  Tobacco Use  . Smoking status: Former Smoker    Packs/day: 1.00    Years: 8.00    Pack years: 8.00    Last attempt to quit: 10/15/1956    Years since quitting: 62.1  . Smokeless tobacco: Never Used  Substance and Sexual Activity  . Alcohol use: Yes    Alcohol/week: 14.0 standard drinks    Types: 14 Glasses of wine per week    Comment: 06/20/2016 "2 glasses of wine q night"  . Drug use: No  . Sexual activity: Not on file  Lifestyle  . Physical activity:    Days per week: Not on file    Minutes per session: Not on file  . Stress: Not on file  Relationships  . Social connections:    Talks on phone: Not on file    Gets together: Not on file    Attends religious service: Not on file    Active member of club or organization: Not on file    Attends meetings of clubs or organizations: Not on file    Relationship status: Not on file  Other Topics Concern  . Not on file  Social History Narrative  . Not on file     ROS:   Please see the history of present illness.    ROS All other systems reviewed and are negative.   PHYSICAL EXAM:   VS:  BP 132/62   Pulse 85   Ht 5' 8"  (1.727 m)   Wt 177 lb 6.4 oz (80.5 kg)   BMI 26.97 kg/m     General: Alert, oriented x3, no distress, overweight Head: no evidence of trauma, PERRL, EOMI, no exophtalmos or lid lag, no myxedema, no xanthelasma; normal ears, nose and oropharynx Neck: normal jugular venous pulsations and no hepatojugular reflux; brisk carotid pulses without delay and no carotid bruits Chest: clear to auscultation, no signs of consolidation by percussion or palpation, normal fremitus, symmetrical and full respiratory excursions Cardiovascular: normal position and quality of the apical impulse, regular rhythm, normal first and paradoxically split second heart sounds, no murmurs, rubs or gallops Abdomen: no tenderness or distention, no masses by palpation, no abnormal pulsatility or arterial  bruits, normal bowel sounds, no hepatosplenomegaly Extremities: no clubbing, cyanosis or edema; 2+ radial, ulnar and brachial pulses bilaterally; 2+ right femoral, posterior tibial and dorsalis pedis pulses; 2+ left femoral, posterior tibial and dorsalis pedis pulses; no subclavian or femoral bruits Neurological: grossly nonfocal Psych: Normal mood and affect   Wt Readings from Last 3 Encounters:  11/06/18 177 lb 6.4 oz (80.5 kg)  10/22/17 191 lb (86.6 kg)  09/25/16 193 lb 9.6 oz (87.8 kg)      Studies/Labs Reviewed:   EKG:  EKG is ordered today.  100% ventricular pacing on background of atrial fibrillation  Recent Labs:  Lipid Panel  No results found for: CHOL, TRIG, HDL, CHOLHDL, VLDL, LDLCALC, LDLDIRECT  04/25/2018 A1c 6.1% Chol 155, TG 127, LDL 71, HDL 58  08/19/2018 K 4.5, creat 1.0, normal LFTs, Hgb 12.7 (MCV 105.7)  ASSESSMENT:    1. Atrial fibrillation with slow ventricular response ([place])   2. Pacemaker   3. Coronary artery disease involving native coronary artery of native heart without angina pectoris   4. Essential hypertension   5. Hypercholesterolemia   6. Long term (current) use of anticoagulants   7. Diabetes mellitus type 2 in nonobese [facility])      PLAN:  In order of problems listed above:  1. Atrial fibrillation with slow ventricular response.  Has virtually 100% ventricular pacing.  Appropriate heart rate histogram distribution, asymptomatic. Not on rate control meds. On warfarin. CHADSVasc 5 (age 27, HTN, CAD, DM).  2. PPM: Normal device function.  Remote check every 3 months.  Yearly office visits 3. CAD s/p CABG: Active, angina-free. 4. HTN: Excellent control 5. HLP: On statin, LDL 71. 6. Warfarin. INR checked/dose adjusted today. No bleeding problems 7. DM: Excellent glycemic control. A1c 6.1 %.     Medication Adjustments/Labs and Tests Ordered: Current medicines are reviewed at length with the patient today.  Concerns regarding medicines are  outlined above.  Medication changes, Labs and Tests ordered today are listed in the Patient Instructions below. Patient Instructions  Medication Instructions:  Your physician recommends that you continue on your current medications as directed. Please refer to the Current Medication list given to you today.  If you need a refill on your cardiac medications before your next appointment, please call your pharmacy.   Lab work: None ordered If you have labs (blood work) drawn today and your tests are completely normal, you will receive your results only by: Marland Kitchen MyChart Message (if you have MyChart) OR . A paper copy in the mail If you have any lab test that is abnormal or we need to change your treatment, we will call you to review the results.  Testing/Procedures: None ordered  Follow-Up: At [facility], you and your health needs are our priority.  As part of our continuing mission to provide you with exceptional heart care, we have created designated Provider Care Teams.  These Care Teams include your primary Cardiologist (physician) and Advanced Practice Providers (APPs -  Physician Assistants and Nurse Practitioners) who all work together to provide you with the care you need, when you need it. You will need a follow up appointment in 12 months.  Please call our office 2 months in advance to schedule this appointment.  You may see Steve Klein, MD or one of the following Advanced Practice Providers on your designated Care Team: [place], [place] . Fabian Sharp, PA-C  Any Other Special Instructions Will Be Listed Below (If Applicable). The Device clinic will contact your regarding setting up your remote home device checks every 3 months.        Signed, Steve Klein, MD  11/06/2018 10:24 AM    [facility] [address], [place], [place]  [address] Phone: [phone]; Fax: [phone]

## 2018-11-08 ENCOUNTER — Encounter: Payer: PPO | Admitting: Cardiovascular Disease

## 2018-11-21 ENCOUNTER — Ambulatory Visit: Payer: PPO | Admitting: Pharmacist Clinician (PhC)/ Clinical Pharmacy Specialist

## 2018-11-21 DIAGNOSIS — I4891 Unspecified atrial fibrillation: Secondary | ICD-10-CM | POA: Diagnosis not present

## 2018-11-21 DIAGNOSIS — Z7901 Long term (current) use of anticoagulants: Secondary | ICD-10-CM | POA: Diagnosis not present

## 2018-11-21 LAB — POCT INR: INR: 2.7 (ref 2.0–3.0)

## 2018-11-22 LAB — CUP PACEART INCLINIC DEVICE CHECK
Implantable Lead Implant Date: 20170905
Implantable Lead Location: 753860
Implantable Pulse Generator Implant Date: 20170905
MDC IDC SESS DTM: 20200207101916

## 2018-12-18 ENCOUNTER — Ambulatory Visit (INDEPENDENT_AMBULATORY_CARE_PROVIDER_SITE_OTHER): Payer: PPO | Admitting: Pharmacist Clinician (PhC)/ Clinical Pharmacy Specialist

## 2018-12-18 DIAGNOSIS — Z7901 Long term (current) use of anticoagulants: Secondary | ICD-10-CM | POA: Diagnosis not present

## 2018-12-18 DIAGNOSIS — I4891 Unspecified atrial fibrillation: Secondary | ICD-10-CM

## 2018-12-18 LAB — POCT INR: INR: 1.4 — AB (ref 2.0–3.0)

## 2019-01-01 ENCOUNTER — Other Ambulatory Visit: Payer: Self-pay

## 2019-01-01 ENCOUNTER — Ambulatory Visit: Payer: PPO | Admitting: Pharmacist Clinician (PhC)/ Clinical Pharmacy Specialist

## 2019-01-01 DIAGNOSIS — Z7901 Long term (current) use of anticoagulants: Secondary | ICD-10-CM

## 2019-01-01 DIAGNOSIS — I4891 Unspecified atrial fibrillation: Secondary | ICD-10-CM

## 2019-01-01 LAB — POCT INR: INR: 1.3 — AB (ref 2.0–3.0)

## 2019-01-01 NOTE — Patient Instructions (Signed)
Take 1.5 tablets Wednesday March 18 and Thursday March 19, then increase dose to 1/2 tablet daily except 1 tablet each Monday, Wednesday and Friday.  Repeat INR in 2 weeks

## 2019-01-14 ENCOUNTER — Telehealth: Payer: Self-pay

## 2019-01-14 NOTE — Telephone Encounter (Signed)

## 2019-01-15 ENCOUNTER — Ambulatory Visit (INDEPENDENT_AMBULATORY_CARE_PROVIDER_SITE_OTHER): Payer: PPO | Admitting: Pharmacist

## 2019-01-15 ENCOUNTER — Other Ambulatory Visit: Payer: Self-pay

## 2019-01-15 DIAGNOSIS — I4891 Unspecified atrial fibrillation: Secondary | ICD-10-CM | POA: Diagnosis not present

## 2019-01-15 DIAGNOSIS — Z7901 Long term (current) use of anticoagulants: Secondary | ICD-10-CM | POA: Diagnosis not present

## 2019-01-15 LAB — POCT INR: INR: 2.4 (ref 2.0–3.0)

## 2019-02-05 ENCOUNTER — Ambulatory Visit (INDEPENDENT_AMBULATORY_CARE_PROVIDER_SITE_OTHER): Payer: PPO | Admitting: *Deleted

## 2019-02-05 ENCOUNTER — Other Ambulatory Visit: Payer: Self-pay

## 2019-02-05 DIAGNOSIS — R001 Bradycardia, unspecified: Secondary | ICD-10-CM | POA: Diagnosis not present

## 2019-02-06 ENCOUNTER — Telehealth: Payer: Self-pay

## 2019-02-06 LAB — CUP PACEART REMOTE DEVICE CHECK
Battery Impedance: 429 Ohm
Battery Remaining Longevity: 88 mo
Battery Voltage: 2.76 V
Brady Statistic RV Percent Paced: 99 %
Date Time Interrogation Session: 20200423112502
Implantable Lead Implant Date: 20170905
Implantable Lead Location: 753860
Implantable Lead Model: 5076
Implantable Pulse Generator Implant Date: 20170905
Lead Channel Impedance Value: 462 Ohm
Lead Channel Pacing Threshold Amplitude: 0.5 V
Lead Channel Pacing Threshold Pulse Width: 0.4 ms
Lead Channel Setting Pacing Amplitude: 2 V
Lead Channel Setting Pacing Pulse Width: 0.4 ms
Lead Channel Setting Sensing Sensitivity: 4 mV

## 2019-02-06 NOTE — Telephone Encounter (Signed)
Spoke with patient to remind of missed remote transmission 

## 2019-02-11 ENCOUNTER — Telehealth: Payer: Self-pay

## 2019-02-11 NOTE — Telephone Encounter (Signed)
No voicemail set up 

## 2019-02-13 ENCOUNTER — Encounter: Payer: Self-pay | Admitting: Cardiology

## 2019-02-13 NOTE — Progress Notes (Signed)
Remote pacemaker transmission.   

## 2019-03-14 ENCOUNTER — Telehealth: Payer: Self-pay | Admitting: Pharmacist

## 2019-03-14 NOTE — Telephone Encounter (Signed)
1. COVID-19 Pre-Screening Questions:  . In the past 7 to 10 days have you had a cough,  shortness of breath, headache, congestion, fever (100 or greater) body aches, chills, sore throat, or sudden loss of taste or sense of smell? . Have you been around anyone with known Covid 19. . Have you been around anyone who is awaiting Covid 19 test results in the past 7 to 10 days? . Have you been around anyone who has been exposed to Covid 19, or has mentioned symptoms of Covid 19 within the past 7 to 10 days? No to all questions  2. Pt advised of visitor restrictions (no visitors allowed except if needed to conduct the visit). Also advised to arrive at appointment time and wear a mask.     

## 2019-03-19 ENCOUNTER — Ambulatory Visit (INDEPENDENT_AMBULATORY_CARE_PROVIDER_SITE_OTHER): Payer: PPO | Admitting: Pharmacist

## 2019-03-19 ENCOUNTER — Other Ambulatory Visit: Payer: Self-pay

## 2019-03-19 DIAGNOSIS — Z7901 Long term (current) use of anticoagulants: Secondary | ICD-10-CM | POA: Diagnosis not present

## 2019-03-19 DIAGNOSIS — I4891 Unspecified atrial fibrillation: Secondary | ICD-10-CM | POA: Diagnosis not present

## 2019-03-19 LAB — POCT INR: INR: 1 — AB (ref 2.0–3.0)

## 2019-03-19 MED ORDER — APIXABAN 5 MG PO TABS: 5.0000 mg | ORAL_TABLET | Freq: Two times a day (BID) | ORAL | 2 refills | Status: DC

## 2019-03-19 NOTE — Patient Instructions (Signed)
*  STOP taking warfarin* Start taking Eliquis 5mg  twice daily (1st dose today 03/19/2019 in evening)

## 2019-03-20 LAB — BASIC METABOLIC PANEL
BUN/Creatinine Ratio: 23 (ref 10–24)
BUN: 26 mg/dL (ref 8–27)
CO2: 24 mmol/L (ref 20–29)
Calcium: 10 mg/dL (ref 8.6–10.2)
Chloride: 99 mmol/L (ref 96–106)
Creatinine, Ser: 1.11 mg/dL (ref 0.76–1.27)
GFR calc Af Amer: 69 mL/min/{1.73_m2} (ref >59)
GFR calc non Af Amer: 59 mL/min/{1.73_m2} — ABNORMAL LOW (ref >59)
Glucose: 102 mg/dL — ABNORMAL HIGH (ref 65–99)
Potassium: 4.9 mmol/L (ref 3.5–5.2)
Sodium: 140 mmol/L (ref 134–144)

## 2019-03-24 DIAGNOSIS — D649 Anemia, unspecified: Secondary | ICD-10-CM | POA: Diagnosis not present

## 2019-03-24 DIAGNOSIS — R05 Cough: Secondary | ICD-10-CM | POA: Diagnosis not present

## 2019-03-24 DIAGNOSIS — R630 Anorexia: Secondary | ICD-10-CM | POA: Diagnosis not present

## 2019-03-24 DIAGNOSIS — R413 Other amnesia: Secondary | ICD-10-CM | POA: Diagnosis not present

## 2019-03-24 DIAGNOSIS — R197 Diarrhea, unspecified: Secondary | ICD-10-CM | POA: Diagnosis not present

## 2019-04-04 DIAGNOSIS — F0390 Unspecified dementia without behavioral disturbance: Secondary | ICD-10-CM | POA: Diagnosis not present

## 2019-04-04 DIAGNOSIS — R197 Diarrhea, unspecified: Secondary | ICD-10-CM | POA: Diagnosis not present

## 2019-04-04 DIAGNOSIS — R413 Other amnesia: Secondary | ICD-10-CM | POA: Diagnosis not present

## 2019-04-04 DIAGNOSIS — E119 Type 2 diabetes mellitus without complications: Secondary | ICD-10-CM | POA: Diagnosis not present

## 2019-04-04 DIAGNOSIS — Z950 Presence of cardiac pacemaker: Secondary | ICD-10-CM | POA: Diagnosis not present

## 2019-04-04 DIAGNOSIS — I10 Essential (primary) hypertension: Secondary | ICD-10-CM | POA: Diagnosis not present

## 2019-04-04 DIAGNOSIS — I2510 Atherosclerotic heart disease of native coronary artery without angina pectoris: Secondary | ICD-10-CM | POA: Diagnosis not present

## 2019-04-16 DIAGNOSIS — E119 Type 2 diabetes mellitus without complications: Secondary | ICD-10-CM | POA: Diagnosis not present

## 2019-04-17 DIAGNOSIS — D2272 Melanocytic nevi of left lower limb, including hip: Secondary | ICD-10-CM | POA: Diagnosis not present

## 2019-04-17 DIAGNOSIS — Z08 Encounter for follow-up examination after completed treatment for malignant neoplasm: Secondary | ICD-10-CM | POA: Diagnosis not present

## 2019-04-17 DIAGNOSIS — L570 Actinic keratosis: Secondary | ICD-10-CM | POA: Diagnosis not present

## 2019-04-17 DIAGNOSIS — L821 Other seborrheic keratosis: Secondary | ICD-10-CM | POA: Diagnosis not present

## 2019-04-17 DIAGNOSIS — X32XXXA Exposure to sunlight, initial encounter: Secondary | ICD-10-CM | POA: Diagnosis not present

## 2019-04-17 DIAGNOSIS — D2262 Melanocytic nevi of left upper limb, including shoulder: Secondary | ICD-10-CM | POA: Diagnosis not present

## 2019-04-17 DIAGNOSIS — D2261 Melanocytic nevi of right upper limb, including shoulder: Secondary | ICD-10-CM | POA: Diagnosis not present

## 2019-04-17 DIAGNOSIS — Z85828 Personal history of other malignant neoplasm of skin: Secondary | ICD-10-CM | POA: Diagnosis not present

## 2019-04-21 DIAGNOSIS — R413 Other amnesia: Secondary | ICD-10-CM | POA: Diagnosis not present

## 2019-04-21 DIAGNOSIS — R930 Abnormal findings on diagnostic imaging of skull and head, not elsewhere classified: Secondary | ICD-10-CM | POA: Diagnosis not present

## 2019-04-21 DIAGNOSIS — G319 Degenerative disease of nervous system, unspecified: Secondary | ICD-10-CM | POA: Diagnosis not present

## 2019-04-21 DIAGNOSIS — J320 Chronic maxillary sinusitis: Secondary | ICD-10-CM | POA: Diagnosis not present

## 2019-05-07 ENCOUNTER — Ambulatory Visit (INDEPENDENT_AMBULATORY_CARE_PROVIDER_SITE_OTHER): Payer: PPO | Admitting: *Deleted

## 2019-05-07 DIAGNOSIS — I4891 Unspecified atrial fibrillation: Secondary | ICD-10-CM

## 2019-05-07 DIAGNOSIS — R001 Bradycardia, unspecified: Secondary | ICD-10-CM

## 2019-05-08 ENCOUNTER — Telehealth: Payer: Self-pay

## 2019-05-08 LAB — CUP PACEART REMOTE DEVICE CHECK
Battery Remaining Longevity: 66 mo
Battery Voltage: 2.77 V
Brady Statistic RV Percent Paced: 98.8 %
Date Time Interrogation Session: 20200723121157
Implantable Lead Implant Date: 20170905
Implantable Lead Location: 753860
Implantable Lead Model: 5076
Implantable Pulse Generator Implant Date: 20170905
Lead Channel Impedance Value: 444 Ohm
Lead Channel Pacing Threshold Amplitude: 0.5 V
Lead Channel Pacing Threshold Pulse Width: 0.4 ms
Lead Channel Setting Pacing Amplitude: 2 V
Lead Channel Setting Pacing Pulse Width: 0.4 ms
Lead Channel Setting Sensing Sensitivity: 4 mV

## 2019-05-08 NOTE — Telephone Encounter (Signed)
Spoke with patient to remind of missed remote transmission 

## 2019-05-22 NOTE — Progress Notes (Signed)
Remote pacemaker transmission.   

## 2019-05-23 DIAGNOSIS — I6203 Nontraumatic chronic subdural hemorrhage: Secondary | ICD-10-CM | POA: Diagnosis not present

## 2019-05-23 DIAGNOSIS — R197 Diarrhea, unspecified: Secondary | ICD-10-CM | POA: Diagnosis not present

## 2019-05-23 DIAGNOSIS — F0391 Unspecified dementia with behavioral disturbance: Secondary | ICD-10-CM | POA: Diagnosis not present

## 2019-05-23 DIAGNOSIS — R634 Abnormal weight loss: Secondary | ICD-10-CM | POA: Diagnosis not present

## 2019-05-23 DIAGNOSIS — E119 Type 2 diabetes mellitus without complications: Secondary | ICD-10-CM | POA: Diagnosis not present

## 2019-06-19 ENCOUNTER — Other Ambulatory Visit: Payer: Self-pay | Admitting: Cardiovascular Disease

## 2019-06-19 NOTE — Telephone Encounter (Signed)
67m 80.5kg Scr 1.11 03/19/19 Lovw/croitoru 11/06/18

## 2019-06-19 NOTE — Telephone Encounter (Signed)
Refill request

## 2019-07-28 DIAGNOSIS — I10 Essential (primary) hypertension: Secondary | ICD-10-CM | POA: Diagnosis not present

## 2019-07-28 DIAGNOSIS — E119 Type 2 diabetes mellitus without complications: Secondary | ICD-10-CM | POA: Diagnosis not present

## 2019-07-28 DIAGNOSIS — F0391 Unspecified dementia with behavioral disturbance: Secondary | ICD-10-CM | POA: Diagnosis not present

## 2019-08-07 ENCOUNTER — Ambulatory Visit (INDEPENDENT_AMBULATORY_CARE_PROVIDER_SITE_OTHER): Payer: PPO | Admitting: *Deleted

## 2019-08-07 DIAGNOSIS — R001 Bradycardia, unspecified: Secondary | ICD-10-CM

## 2019-08-07 DIAGNOSIS — I4891 Unspecified atrial fibrillation: Secondary | ICD-10-CM | POA: Diagnosis not present

## 2019-08-09 LAB — CUP PACEART REMOTE DEVICE CHECK
Battery Impedance: 630 Ohm
Battery Remaining Longevity: 75 mo
Battery Voltage: 2.76 V
Brady Statistic RV Percent Paced: 99 %
Date Time Interrogation Session: 20201023152211
Implantable Lead Implant Date: 20170905
Implantable Lead Location: 753860
Implantable Lead Model: 5076
Implantable Pulse Generator Implant Date: 20170905
Lead Channel Impedance Value: 0 Ohm
Lead Channel Impedance Value: 464 Ohm
Lead Channel Pacing Threshold Amplitude: 0.5 V
Lead Channel Pacing Threshold Pulse Width: 0.4 ms
Lead Channel Setting Pacing Amplitude: 2 V
Lead Channel Setting Pacing Pulse Width: 0.4 ms
Lead Channel Setting Sensing Sensitivity: 4 mV

## 2019-08-19 NOTE — Progress Notes (Signed)
Remote pacemaker transmission.   

## 2019-09-30 DIAGNOSIS — F0391 Unspecified dementia with behavioral disturbance: Secondary | ICD-10-CM | POA: Diagnosis not present

## 2019-09-30 DIAGNOSIS — I10 Essential (primary) hypertension: Secondary | ICD-10-CM | POA: Diagnosis not present

## 2019-11-06 ENCOUNTER — Ambulatory Visit (INDEPENDENT_AMBULATORY_CARE_PROVIDER_SITE_OTHER): Payer: PPO | Admitting: *Deleted

## 2019-11-06 DIAGNOSIS — I4891 Unspecified atrial fibrillation: Secondary | ICD-10-CM

## 2019-11-06 LAB — CUP PACEART REMOTE DEVICE CHECK
Battery Impedance: 707 Ohm
Battery Remaining Longevity: 71 mo
Battery Voltage: 2.77 V
Brady Statistic RV Percent Paced: 99 %
Date Time Interrogation Session: 20210121145423
Implantable Lead Implant Date: 20170905
Implantable Lead Location: 753860
Implantable Lead Model: 5076
Implantable Pulse Generator Implant Date: 20170905
Lead Channel Impedance Value: 0 Ohm
Lead Channel Impedance Value: 429 Ohm
Lead Channel Pacing Threshold Amplitude: 0.5 V
Lead Channel Pacing Threshold Pulse Width: 0.4 ms
Lead Channel Setting Pacing Amplitude: 2 V
Lead Channel Setting Pacing Pulse Width: 0.4 ms
Lead Channel Setting Sensing Sensitivity: 4 mV

## 2019-11-10 ENCOUNTER — Encounter: Payer: PPO | Admitting: Cardiovascular Disease

## 2019-12-01 ENCOUNTER — Encounter: Payer: PPO | Admitting: Cardiovascular Disease

## 2019-12-15 ENCOUNTER — Other Ambulatory Visit: Payer: Self-pay

## 2019-12-15 ENCOUNTER — Encounter: Payer: Self-pay | Admitting: Cardiovascular Disease

## 2019-12-15 ENCOUNTER — Ambulatory Visit: Payer: PPO | Admitting: Cardiovascular Disease

## 2019-12-15 VITALS — BP 132/70 | HR 64 | Temp 97.1°F | Ht 67.0 in | Wt 170.0 lb

## 2019-12-15 DIAGNOSIS — I4891 Unspecified atrial fibrillation: Secondary | ICD-10-CM | POA: Diagnosis not present

## 2019-12-15 DIAGNOSIS — Z7901 Long term (current) use of anticoagulants: Secondary | ICD-10-CM | POA: Diagnosis not present

## 2019-12-15 DIAGNOSIS — Z950 Presence of cardiac pacemaker: Secondary | ICD-10-CM | POA: Diagnosis not present

## 2019-12-15 DIAGNOSIS — I2510 Atherosclerotic heart disease of native coronary artery without angina pectoris: Secondary | ICD-10-CM | POA: Diagnosis not present

## 2019-12-15 DIAGNOSIS — E7800 Pure hypercholesterolemia, unspecified: Secondary | ICD-10-CM

## 2019-12-15 DIAGNOSIS — E119 Type 2 diabetes mellitus without complications: Secondary | ICD-10-CM

## 2019-12-15 DIAGNOSIS — I10 Essential (primary) hypertension: Secondary | ICD-10-CM | POA: Diagnosis not present

## 2019-12-15 LAB — CBC
Hematocrit: 41.3 % (ref 37.5–51.0)
Hemoglobin: 14 g/dL (ref 13.0–17.7)
MCH: 34.1 pg — ABNORMAL HIGH (ref 26.6–33.0)
MCHC: 33.9 g/dL (ref 31.5–35.7)
MCV: 101 fL — ABNORMAL HIGH (ref 79–97)
Platelets: 183 10*3/uL (ref 150–450)
RBC: 4.11 x10E6/uL — ABNORMAL LOW (ref 4.14–5.80)
RDW: 12.3 % (ref 11.6–15.4)
WBC: 7.8 10*3/uL (ref 3.4–10.8)

## 2019-12-15 LAB — COMPREHENSIVE METABOLIC PANEL
ALT: 14 IU/L (ref 0–44)
AST: 18 IU/L (ref 0–40)
Albumin/Globulin Ratio: 1.4 (ref 1.2–2.2)
Albumin: 4.2 g/dL (ref 3.6–4.6)
Alkaline Phosphatase: 109 IU/L (ref 39–117)
BUN/Creatinine Ratio: 22 (ref 10–24)
BUN: 26 mg/dL (ref 8–27)
Bilirubin Total: 0.6 mg/dL (ref 0.0–1.2)
CO2: 22 mmol/L (ref 20–29)
Calcium: 9.4 mg/dL (ref 8.6–10.2)
Chloride: 104 mmol/L (ref 96–106)
Creatinine, Ser: 1.19 mg/dL (ref 0.76–1.27)
GFR calc Af Amer: 63 mL/min/{1.73_m2} (ref >59)
GFR calc non Af Amer: 54 mL/min/{1.73_m2} — ABNORMAL LOW (ref >59)
Globulin, Total: 2.9 g/dL (ref 1.5–4.5)
Glucose: 99 mg/dL (ref 65–99)
Potassium: 5 mmol/L (ref 3.5–5.2)
Sodium: 139 mmol/L (ref 134–144)
Total Protein: 7.1 g/dL (ref 6.0–8.5)

## 2019-12-15 LAB — LIPID PANEL
Chol/HDL Ratio: 2.3 ratio (ref 0.0–5.0)
Cholesterol, Total: 138 mg/dL (ref 100–199)
HDL: 60 mg/dL (ref >39)
LDL Chol Calc (NIH): 61 mg/dL (ref 0–99)
Triglycerides: 92 mg/dL (ref 0–149)
VLDL Cholesterol Cal: 17 mg/dL (ref 5–40)

## 2019-12-15 LAB — HEMOGLOBIN A1C
Est. average glucose Bld gHb Est-mCnc: 117 mg/dL
Hgb A1c MFr Bld: 5.7 % — ABNORMAL HIGH (ref 4.8–5.6)

## 2019-12-15 NOTE — Patient Instructions (Signed)
Medication Instructions:  No changes If you need a refill on your cardiac medications before your next appointment, please call your pharmacy.   Lab work: Your provider would like for you to have the following labs today: CBC, CMET, A1C, and Lipid  If you have labs (blood work) drawn today and your tests are completely normal, you will receive your results only by: [place] (if you have MyChart) OR A paper copy in the mail If you have any lab test that is abnormal or we need to change your treatment, we will call you to review the results.  Testing/Procedures: None ordered  Follow-Up: At [facility], you and your health needs are our priority.  As part of our continuing mission to provide you with exceptional heart care, we have created designated Provider Care Teams.  These Care Teams include your primary Cardiologist (physician) and Advanced Practice Providers (APPs -  Physician Assistants and Nurse Practitioners) who all work together to provide you with the care you need, when you need it.  We recommend signing up for the patient portal called "MyChart".  Sign up information is provided on this After Visit Summary.  MyChart is used to connect with patients for Virtual Visits (Telemedicine).  Patients are able to view lab/test results, encounter notes, upcoming appointments, etc.  Non-urgent messages can be sent to your provider as well.   To learn more about what you can do with MyChart, go to [email or web address].    Your next appointment:   12 month(s)  The format for your next appointment:   In Person  Provider:   Sanda Klein, MD   Remote monitoring is used to monitor your pacermaker from home. This monitoring reduces the number of office visits required to check your device to one time per year. It allows [place] to keep an eye on the functioning of your device to ensure it is working properly. You are scheduled for a device check from home on 02/05/20. You may send  your transmission at any time that day. If you have a wireless device, the transmission will be sent automatically.

## 2019-12-15 NOTE — Progress Notes (Signed)
Patient ID: Steve Lloyd, male   DOB: [birth year], 86 y.o.   MRN: [id number]    Cardiology Office Note    Date:  12/15/2019   ID:  Steve Lloyd, Steve Lloyd Jul 15, 1930, MRN [id number]  PCP:  Steve Pink, MD  Cardiologist:   Steve Klein, MD   Chief Complaint  Patient presents with  . Atrial Fibrillation  . Pacemaker Check  CAD s/p CABG  History of Present Illness:  Steve Lloyd is a 86 y.o. male with CAD s/p coronary bypass surgery in 1998 (single-vessel LIMA to LAD) and has not had recurrent coronary problems since that time.   He has permanent atrial fibrillation with slow ventricular response, on warfarin anticoagulation, s/p single-chamber permanent pacemaker ([employer] [place] [id number]) in September 2017.    He feels well. The patient specifically denies any chest pain at rest exertion, dyspnea at rest or with exertion, orthopnea, paroxysmal nocturnal dyspnea, syncope, palpitations, focal neurological deficits, intermittent claudication, lower extremity edema, unexplained weight gain, cough, hemoptysis or wheezing. He denies falls or bleeding.  Device interrogation shows normal function. The device is programmed VVIR with a lower rate limit of 60 bpm.  Estimated generator longevity is 5 years.  He has 98.6% ventricular pacing.  The heart rate histogram distribution appears favorable.  He has only had only 2 episodes of high ventricular rate, both under 10 seconds and >9 months ago.  Lead parameters are excellent. Underlying rhythm is atrial fibrillation with slow ventricular rate is in the 40s - he is not truly device dependent.  Labs have not been checked in about a year.  He is overweight, has diabetes mellitus on oral medications. He takes a statin for hyperlipidemia.   Past Medical History:  Diagnosis Date  . Atrial fibrillation with slow ventricular response ([place])    hx/notes 06/20/2016  . Coronary artery disease   . Hyperlipidemia   . Kidney stones   . Pneumonia ~ 1950    . Presence of permanent cardiac pacemaker   . S/P cardiac pacemaker procedure, [employer] 06/20/16  06/21/2016  . Type II diabetes mellitus ([place])     Past Surgical History:  Procedure Laterality Date  . APPENDECTOMY    . CATARACT EXTRACTION W/ INTRAOCULAR LENS  IMPLANT, BILATERAL Bilateral   . CORONARY ARTERY BYPASS GRAFT  1998   single-vessel LIMA to LAD/notes 06/20/2016  . EP IMPLANTABLE DEVICE N/A 06/20/2016   Procedure: Pacemaker Implant;  Surgeon: Steve Klein, MD;  Location: [facility];  Service: Cardiovascular;  Laterality: N/A;  . INGUINAL HERNIA REPAIR Right   . INSERT / REPLACE / REMOVE PACEMAKER  06/20/2016    Outpatient Medications Prior to Visit  Medication Sig Dispense Refill  . amLODipine (NORVASC) 5 MG tablet Take by mouth.    . donepezil (ARICEPT) 5 MG tablet Take by mouth.    . pioglitazone-metformin (ACTOPLUS MET) 15-500 MG tablet Take by mouth.    Marland Kitchen acetaminophen (TYLENOL) 325 MG tablet Take 1-2 tablets (325-650 mg total) by mouth every 4 (four) hours as needed for mild pain.    Marland Kitchen aspirin 81 MG EC tablet     . atorvastatin (LIPITOR) 10 MG tablet Take 10 mg by mouth daily.    . benazepril (LOTENSIN) 10 MG tablet Take 10 mg by mouth daily.    . Cholecalciferol 25 MCG (1000 UT) tablet daily.    . Cyanocobalamin (VITAMIN B-12 IJ) Inject 1 mg as directed every 30 (thirty) days.     Marland Kitchen ELIQUIS 5 MG  TABS tablet TAKE 1 TABLET BY MOUTH TWICE A DAY 180 tablet 1  . Multiple Vitamin (MULTIVITAMIN) tablet Take 1 tablet by mouth daily.    . pioglitazone-metformin (ACTOPLUS MET) 15-500 MG tablet TAKE 1 TABLET BY MOUTH 2 (TWO) TIMES DAILY WITH MEALS.  1  . rivastigmine (EXELON) 4.6 mg/24hr 4.6 mg daily.    . sertraline (ZOLOFT) 50 MG tablet Take 50 mg by mouth daily.    Marland Kitchen amLODipine (NORVASC) 5 MG tablet Take 5 mg by mouth daily.     No facility-administered medications prior to visit.     Allergies:   Patient has no known allergies.   Social History    Socioeconomic History  . Marital status: Married    Spouse name: Not on file  . Number of children: Not on file  . Years of education: Not on file  . Highest education level: Not on file  Occupational History  . Not on file  Tobacco Use  . Smoking status: Former Smoker    Packs/day: 1.00    Years: 8.00    Pack years: 8.00    Quit date: 10/15/1956    Years since quitting: 63.2  . Smokeless tobacco: Never Used  Substance and Sexual Activity  . Alcohol use: Yes    Alcohol/week: 14.0 standard drinks    Types: 14 Glasses of wine per week    Comment: 06/20/2016 "2 glasses of wine q night"  . Drug use: No  . Sexual activity: Not on file  Other Topics Concern  . Not on file  Social History Narrative  . Not on file   Social Determinants of Health   Financial Resource Strain:   . Difficulty of Paying Living Expenses: Not on file  Food Insecurity:   . Worried About Charity fundraiser in the Last Year: Not on file  . Ran Out of Food in the Last Year: Not on file  Transportation Needs:   . Lack of Transportation (Medical): Not on file  . Lack of Transportation (Non-Medical): Not on file  Physical Activity:   . Days of Exercise per Week: Not on file  . Minutes of Exercise per Session: Not on file  Stress:   . Feeling of Stress : Not on file  Social Connections:   . Frequency of Communication with Friends and Family: Not on file  . Frequency of Social Gatherings with Friends and Family: Not on file  . Attends Religious Services: Not on file  . Active Member of Clubs or Organizations: Not on file  . Attends Archivist Meetings: Not on file  . Marital Status: Not on file     ROS:   Please see the history of present illness.    ROS  All other systems are reviewed and are negative.  PHYSICAL EXAM:   VS:  BP 132/70   Pulse 64   Temp (!) 97.1 F (36.2 C)   Ht _0  (1.702 m)   Wt 170 lb (77.1 kg)   SpO2 96%   BMI 26.63 kg/m     General: Alert, oriented x3,  no distress, overweight, healthy pacer site L subclavian. Head: no evidence of trauma, PERRL, EOMI, no exophtalmos or lid lag, no myxedema, no xanthelasma; normal ears, nose and oropharynx Neck: normal jugular venous pulsations and no hepatojugular reflux; brisk carotid pulses without delay and no carotid bruits Chest: clear to auscultation, no signs of consolidation by percussion or palpation, normal fremitus, symmetrical and full respiratory excursions Cardiovascular: normal  position and quality of the apical impulse, regular rhythm, normal first and paradoxically split second heart sounds, no murmurs, rubs or gallops Abdomen: no tenderness or distention, no masses by palpation, no abnormal pulsatility or arterial bruits, normal bowel sounds, no hepatosplenomegaly Extremities: no clubbing, cyanosis or edema; 2+ radial, ulnar and brachial pulses bilaterally; 2+ right femoral, posterior tibial and dorsalis pedis pulses; 2+ left femoral, posterior tibial and dorsalis pedis pulses; no subclavian or femoral bruits Neurological: grossly nonfocal Psych: Normal mood and affect   Wt Readings from Last 3 Encounters:  12/15/19 170 lb (77.1 kg)  11/06/18 177 lb 6.4 oz (80.5 kg)  10/22/17 191 lb (86.6 kg)      Studies/Labs Reviewed:   EKG:  EKG is ordered today.  AFib with 100% V pacing Recent Labs:  Lipid Panel  No results found for: CHOL, TRIG, HDL, CHOLHDL, VLDL, LDLCALC, LDLDIRECT  04/25/2018 A1c 6.1% Chol 155, TG 127, LDL 71, HDL 58  08/19/2018 K 4.5, creat 1.0, normal LFTs, Hgb 12.7 (MCV 105.7)  ASSESSMENT:    1. Atrial fibrillation with slow ventricular response ([place])   2. Pacemaker   3. Coronary artery disease involving native coronary artery of native heart without angina pectoris   4. Essential hypertension   5. Hypercholesterolemia   6. Diabetes mellitus type 2 in nonobese (HCC)   7. Long term current use of anticoagulant therapy      PLAN:  In order of problems listed  above:  1. Atrial fibrillation with slow ventricular response:  Appropriate heart rate histogram distribution with current sensor settings, without rate control meds. Unaware of palpitations. Does have a very slow underlying rhythm in the 40s.On warfarin. CHADSVasc 5 (age 35, HTN, CAD, DM).  2. PPM: Normal device function.  Remote check every 3 months.  Yearly office visits 3. CAD s/p CABG: Denies angina. Not on ASA due to anticoagulation. 4. HTN: good control, continue amlodipine-benazepril. 5. HLP: On statin. Most recent LDL at target, but > 1 year since checked. Labs today. 6. Eliquis: no bleeding complications. 7. DM: Excellent glycemic control. A1c 5.5% on 07/28/2019.     Medication Adjustments/Labs and Tests Ordered: Current medicines are reviewed at length with the patient today.  Concerns regarding medicines are outlined above.  Medication changes, Labs and Tests ordered today are listed in the Patient Instructions below. Patient Instructions  Medication Instructions:  No changes If you need a refill on your cardiac medications before your next appointment, please call your pharmacy.   Lab work: Your provider would like for you to have the following labs today: CBC, CMET, A1C, and Lipid  If you have labs (blood work) drawn today and your tests are completely normal, you will receive your results only by: [place] (if you have MyChart) OR A paper copy in the mail If you have any lab test that is abnormal or we need to change your treatment, we will call you to review the results.  Testing/Procedures: None ordered  Follow-Up: At [facility], you and your health needs are our priority.  As part of our continuing mission to provide you with exceptional heart care, we have created designated Provider Care Teams.  These Care Teams include your primary Cardiologist (physician) and Advanced Practice Providers (APPs -  Physician Assistants and Nurse Practitioners) who all work  together to provide you with the care you need, when you need it.  We recommend signing up for the patient portal called "MyChart".  Sign up information is provided on  this After Visit Summary.  MyChart is used to connect with patients for Virtual Visits (Telemedicine).  Patients are able to view lab/test results, encounter notes, upcoming appointments, etc.  Non-urgent messages can be sent to your provider as well.   To learn more about what you can do with MyChart, go to [email or web address].    Your next appointment:   12 month(s)  The format for your next appointment:   In Person  Provider:   Sanda Klein, MD   Remote monitoring is used to monitor your pacermaker from home. This monitoring reduces the number of office visits required to check your device to one time per year. It allows [place] to keep an eye on the functioning of your device to ensure it is working properly. You are scheduled for a device check from home on 02/05/20. You may send your transmission at any time that day. If you have a wireless device, the transmission will be sent automatically.         Signed, Steve Klein, MD  12/15/2019 4:09 PM    [facility] [address], [place], [place]  [address] Phone: [phone]; Fax: [phone]

## 2019-12-16 ENCOUNTER — Encounter: Payer: Self-pay | Admitting: *Deleted

## 2019-12-18 DIAGNOSIS — L570 Actinic keratosis: Secondary | ICD-10-CM | POA: Diagnosis not present

## 2019-12-18 DIAGNOSIS — D225 Melanocytic nevi of trunk: Secondary | ICD-10-CM | POA: Diagnosis not present

## 2019-12-18 DIAGNOSIS — X32XXXA Exposure to sunlight, initial encounter: Secondary | ICD-10-CM | POA: Diagnosis not present

## 2019-12-18 DIAGNOSIS — Z85828 Personal history of other malignant neoplasm of skin: Secondary | ICD-10-CM | POA: Diagnosis not present

## 2019-12-18 DIAGNOSIS — D2261 Melanocytic nevi of right upper limb, including shoulder: Secondary | ICD-10-CM | POA: Diagnosis not present

## 2019-12-18 DIAGNOSIS — L821 Other seborrheic keratosis: Secondary | ICD-10-CM | POA: Diagnosis not present

## 2019-12-18 DIAGNOSIS — D2262 Melanocytic nevi of left upper limb, including shoulder: Secondary | ICD-10-CM | POA: Diagnosis not present

## 2019-12-18 DIAGNOSIS — D692 Other nonthrombocytopenic purpura: Secondary | ICD-10-CM | POA: Diagnosis not present

## 2019-12-18 DIAGNOSIS — D2271 Melanocytic nevi of right lower limb, including hip: Secondary | ICD-10-CM | POA: Diagnosis not present

## 2019-12-19 ENCOUNTER — Other Ambulatory Visit (INDEPENDENT_AMBULATORY_CARE_PROVIDER_SITE_OTHER): Payer: PPO

## 2019-12-19 DIAGNOSIS — I10 Essential (primary) hypertension: Secondary | ICD-10-CM | POA: Diagnosis not present

## 2019-12-26 ENCOUNTER — Other Ambulatory Visit: Payer: Self-pay | Admitting: Cardiovascular Disease

## 2019-12-26 DIAGNOSIS — F0391 Unspecified dementia with behavioral disturbance: Secondary | ICD-10-CM | POA: Diagnosis not present

## 2019-12-26 DIAGNOSIS — R634 Abnormal weight loss: Secondary | ICD-10-CM | POA: Diagnosis not present

## 2019-12-26 DIAGNOSIS — I10 Essential (primary) hypertension: Secondary | ICD-10-CM | POA: Diagnosis not present

## 2019-12-26 NOTE — Telephone Encounter (Signed)
89 M 77.1 kg, SCr 1.19, LOV 3.21 Croitoru

## 2020-02-05 ENCOUNTER — Ambulatory Visit (INDEPENDENT_AMBULATORY_CARE_PROVIDER_SITE_OTHER): Payer: PPO | Admitting: *Deleted

## 2020-02-05 DIAGNOSIS — I4891 Unspecified atrial fibrillation: Secondary | ICD-10-CM

## 2020-02-05 LAB — CUP PACEART REMOTE DEVICE CHECK
Battery Impedance: 970 Ohm
Battery Remaining Longevity: 60 mo
Battery Voltage: 2.76 V
Brady Statistic RV Percent Paced: 99 %
Date Time Interrogation Session: 20210422110114
Implantable Lead Implant Date: 20170905
Implantable Lead Location: 753860
Implantable Lead Model: 5076
Implantable Pulse Generator Implant Date: 20170905
Lead Channel Impedance Value: 0 Ohm
Lead Channel Impedance Value: 436 Ohm
Lead Channel Pacing Threshold Amplitude: 0.5 V
Lead Channel Pacing Threshold Pulse Width: 0.4 ms
Lead Channel Setting Pacing Amplitude: 2 V
Lead Channel Setting Pacing Pulse Width: 0.4 ms
Lead Channel Setting Sensing Sensitivity: 4 mV

## 2020-02-06 NOTE — Progress Notes (Signed)
PPM Remote  

## 2020-03-29 DIAGNOSIS — F0391 Unspecified dementia with behavioral disturbance: Secondary | ICD-10-CM | POA: Diagnosis not present

## 2020-03-29 DIAGNOSIS — I10 Essential (primary) hypertension: Secondary | ICD-10-CM | POA: Diagnosis not present

## 2020-03-29 DIAGNOSIS — L309 Dermatitis, unspecified: Secondary | ICD-10-CM | POA: Diagnosis not present

## 2020-03-29 DIAGNOSIS — E119 Type 2 diabetes mellitus without complications: Secondary | ICD-10-CM | POA: Diagnosis not present

## 2020-06-15 ENCOUNTER — Other Ambulatory Visit: Payer: Self-pay | Admitting: Cardiovascular Disease

## 2020-07-28 DIAGNOSIS — I4891 Unspecified atrial fibrillation: Secondary | ICD-10-CM | POA: Diagnosis not present

## 2020-07-28 DIAGNOSIS — F0391 Unspecified dementia with behavioral disturbance: Secondary | ICD-10-CM | POA: Diagnosis not present

## 2020-07-28 DIAGNOSIS — Z23 Encounter for immunization: Secondary | ICD-10-CM | POA: Diagnosis not present

## 2020-07-28 DIAGNOSIS — I6203 Nontraumatic chronic subdural hemorrhage: Secondary | ICD-10-CM | POA: Diagnosis not present

## 2020-07-28 DIAGNOSIS — E119 Type 2 diabetes mellitus without complications: Secondary | ICD-10-CM | POA: Diagnosis not present

## 2020-11-04 ENCOUNTER — Ambulatory Visit (INDEPENDENT_AMBULATORY_CARE_PROVIDER_SITE_OTHER): Payer: PPO

## 2020-11-04 DIAGNOSIS — I4891 Unspecified atrial fibrillation: Secondary | ICD-10-CM

## 2020-11-05 LAB — CUP PACEART REMOTE DEVICE CHECK
Battery Impedance: 1427 Ohm
Battery Remaining Longevity: 48 mo
Battery Voltage: 2.75 V
Brady Statistic RV Percent Paced: 99 %
Date Time Interrogation Session: 20220121103511
Implantable Lead Implant Date: 20170905
Implantable Lead Location: 753860
Implantable Lead Model: 5076
Implantable Pulse Generator Implant Date: 20170905
Lead Channel Impedance Value: 0 Ohm
Lead Channel Impedance Value: 449 Ohm
Lead Channel Pacing Threshold Amplitude: 0.5 V
Lead Channel Pacing Threshold Pulse Width: 0.4 ms
Lead Channel Setting Pacing Amplitude: 2 V
Lead Channel Setting Pacing Pulse Width: 0.4 ms
Lead Channel Setting Sensing Sensitivity: 4 mV

## 2020-11-10 DIAGNOSIS — R918 Other nonspecific abnormal finding of lung field: Secondary | ICD-10-CM | POA: Diagnosis not present

## 2020-11-10 DIAGNOSIS — R9389 Abnormal findings on diagnostic imaging of other specified body structures: Secondary | ICD-10-CM | POA: Diagnosis not present

## 2020-11-10 DIAGNOSIS — R053 Chronic cough: Secondary | ICD-10-CM | POA: Diagnosis not present

## 2020-11-10 DIAGNOSIS — F0391 Unspecified dementia with behavioral disturbance: Secondary | ICD-10-CM | POA: Diagnosis not present

## 2020-11-10 DIAGNOSIS — E119 Type 2 diabetes mellitus without complications: Secondary | ICD-10-CM | POA: Diagnosis not present

## 2020-11-10 DIAGNOSIS — R059 Cough, unspecified: Secondary | ICD-10-CM | POA: Diagnosis not present

## 2020-11-17 NOTE — Progress Notes (Signed)
Remote pacemaker transmission.   

## 2020-11-23 DIAGNOSIS — S2243XA Multiple fractures of ribs, bilateral, initial encounter for closed fracture: Secondary | ICD-10-CM | POA: Diagnosis not present

## 2020-11-23 DIAGNOSIS — K769 Liver disease, unspecified: Secondary | ICD-10-CM | POA: Diagnosis not present

## 2020-11-23 DIAGNOSIS — R599 Enlarged lymph nodes, unspecified: Secondary | ICD-10-CM | POA: Diagnosis not present

## 2020-11-23 DIAGNOSIS — F0390 Unspecified dementia without behavioral disturbance: Secondary | ICD-10-CM | POA: Diagnosis not present

## 2020-11-23 DIAGNOSIS — M479 Spondylosis, unspecified: Secondary | ICD-10-CM | POA: Diagnosis not present

## 2020-11-23 DIAGNOSIS — R918 Other nonspecific abnormal finding of lung field: Secondary | ICD-10-CM | POA: Diagnosis not present

## 2020-11-23 DIAGNOSIS — R053 Chronic cough: Secondary | ICD-10-CM | POA: Diagnosis not present

## 2020-11-23 DIAGNOSIS — J849 Interstitial pulmonary disease, unspecified: Secondary | ICD-10-CM | POA: Diagnosis not present

## 2020-11-23 DIAGNOSIS — R9389 Abnormal findings on diagnostic imaging of other specified body structures: Secondary | ICD-10-CM | POA: Diagnosis not present

## 2020-11-23 DIAGNOSIS — K8020 Calculus of gallbladder without cholecystitis without obstruction: Secondary | ICD-10-CM | POA: Diagnosis not present

## 2020-12-16 DIAGNOSIS — D044 Carcinoma in situ of skin of scalp and neck: Secondary | ICD-10-CM | POA: Diagnosis not present

## 2020-12-16 DIAGNOSIS — D225 Melanocytic nevi of trunk: Secondary | ICD-10-CM | POA: Diagnosis not present

## 2020-12-16 DIAGNOSIS — D485 Neoplasm of uncertain behavior of skin: Secondary | ICD-10-CM | POA: Diagnosis not present

## 2020-12-16 DIAGNOSIS — X32XXXA Exposure to sunlight, initial encounter: Secondary | ICD-10-CM | POA: Diagnosis not present

## 2020-12-16 DIAGNOSIS — L821 Other seborrheic keratosis: Secondary | ICD-10-CM | POA: Diagnosis not present

## 2020-12-16 DIAGNOSIS — Z85828 Personal history of other malignant neoplasm of skin: Secondary | ICD-10-CM | POA: Diagnosis not present

## 2020-12-16 DIAGNOSIS — D2262 Melanocytic nevi of left upper limb, including shoulder: Secondary | ICD-10-CM | POA: Diagnosis not present

## 2020-12-16 DIAGNOSIS — L570 Actinic keratosis: Secondary | ICD-10-CM | POA: Diagnosis not present

## 2020-12-16 DIAGNOSIS — L718 Other rosacea: Secondary | ICD-10-CM | POA: Diagnosis not present

## 2021-01-01 ENCOUNTER — Other Ambulatory Visit: Payer: Self-pay | Admitting: Cardiovascular Disease

## 2021-01-05 DIAGNOSIS — D044 Carcinoma in situ of skin of scalp and neck: Secondary | ICD-10-CM | POA: Diagnosis not present

## 2021-02-03 ENCOUNTER — Ambulatory Visit (INDEPENDENT_AMBULATORY_CARE_PROVIDER_SITE_OTHER): Payer: PPO

## 2021-02-03 DIAGNOSIS — I4891 Unspecified atrial fibrillation: Secondary | ICD-10-CM

## 2021-02-05 LAB — CUP PACEART REMOTE DEVICE CHECK
Battery Impedance: 1626 Ohm
Battery Remaining Longevity: 43 mo
Battery Voltage: 2.76 V
Brady Statistic RV Percent Paced: 99 %
Date Time Interrogation Session: 20220422095755
Implantable Lead Implant Date: 20170905
Implantable Lead Location: 753860
Implantable Lead Model: 5076
Implantable Pulse Generator Implant Date: 20170905
Lead Channel Impedance Value: 0 Ohm
Lead Channel Impedance Value: 443 Ohm
Lead Channel Pacing Threshold Amplitude: 0.5 V
Lead Channel Pacing Threshold Pulse Width: 0.4 ms
Lead Channel Setting Pacing Amplitude: 2 V
Lead Channel Setting Pacing Pulse Width: 0.4 ms
Lead Channel Setting Sensing Sensitivity: 4 mV

## 2021-02-22 NOTE — Progress Notes (Signed)
Remote pacemaker transmission.   

## 2021-02-28 DIAGNOSIS — R1310 Dysphagia, unspecified: Secondary | ICD-10-CM | POA: Diagnosis not present

## 2021-02-28 DIAGNOSIS — J84112 Idiopathic pulmonary fibrosis: Secondary | ICD-10-CM | POA: Diagnosis not present

## 2021-02-28 DIAGNOSIS — I10 Essential (primary) hypertension: Secondary | ICD-10-CM | POA: Diagnosis not present

## 2021-02-28 DIAGNOSIS — F0391 Unspecified dementia with behavioral disturbance: Secondary | ICD-10-CM | POA: Diagnosis not present

## 2021-03-24 ENCOUNTER — Ambulatory Visit
Admission: RE | Admit: 2021-03-24 | Discharge: 2021-03-24 | Disposition: A | Discharge status: Home / Self Care | Payer: PPO | Source: Ambulatory Visit | Attending: Emergency Medicine | Admitting: Emergency Medicine

## 2021-03-24 ENCOUNTER — Other Ambulatory Visit: Payer: Self-pay

## 2021-03-24 VITALS — BP 99/62 | HR 82 | Temp 97.8°F | Resp 18

## 2021-03-24 DIAGNOSIS — H6123 Impacted cerumen, bilateral: Secondary | ICD-10-CM

## 2021-03-24 NOTE — ED Triage Notes (Signed)
Pt presents today along with daughter. Patient is alert, but HOH. She reports that he was told by [facility] that he had wax in his ears and should have it removed before placement of hearing aides.

## 2021-03-24 NOTE — ED Provider Notes (Signed)
Steve Lloyd    CSN: [id number] Arrival date & time: 03/24/21  1355      History   Chief Complaint Chief Complaint  Patient presents with   Ear Fullness    HPI Steve Lloyd is a 87 y.o. male.  Accompanied by his daughter, patient presents with request for earwax removal from both ears.  He is getting new hearing aids and was told that he needed to have his earwax removed first.  The hearing aid place does not remove earwax.  Patient denies ear pain, fever, chills, or other symptoms.  His medical history includes atrial fibrillation, CAD, hypertension, pacemaker, diabetes.  The history is provided by the patient and a relative.   Past Medical History:  Diagnosis Date   Atrial fibrillation with slow ventricular response ([place])    hx/notes 06/20/2016   Coronary artery disease    Hyperlipidemia    Kidney stones    Pneumonia ~ 1950   Presence of permanent cardiac pacemaker    S/P cardiac pacemaker procedure, [employer] 06/20/16  06/21/2016   Type II diabetes mellitus ([place])     Patient Active Problem List   Diagnosis Date Noted   Pacemaker 11/06/2018   S/P cardiac pacemaker procedure, [employer] 06/20/16  06/21/2016   Symptomatic bradycardia 06/14/2016   CAD s/p CABG LIMA to LAD 1998 10/24/2013   Hypercholesterolemia 10/24/2013   Essential hypertension 10/24/2013   Diabetes mellitus type 2 with complications, controlled 10/24/2013   Atrial fibrillation with slow ventricular response ([place]) 01/01/2013   Long term current use of anticoagulant therapy 01/01/2013    Past Surgical History:  Procedure Laterality Date   APPENDECTOMY     CATARACT EXTRACTION W/ INTRAOCULAR LENS  IMPLANT, BILATERAL Bilateral    CORONARY ARTERY BYPASS GRAFT  1998   single-vessel LIMA to LAD/notes 06/20/2016   EP IMPLANTABLE DEVICE N/A 06/20/2016   Procedure: Pacemaker Implant;  Surgeon: Sanda Klein, MD;  Location: [facility];  Service: Cardiovascular;  Laterality: N/A;    INGUINAL HERNIA REPAIR Right    INSERT / REPLACE / REMOVE PACEMAKER  06/20/2016       Home Medications    Prior to Admission medications   Medication Sig Start Date End Date Taking? Authorizing Provider  amLODipine (NORVASC) 5 MG tablet Take by mouth. 02/15/19  Yes [provider]  atorvastatin (LIPITOR) 10 MG tablet Take 10 mg by mouth daily.   Yes [provider]  benazepril (LOTENSIN) 10 MG tablet Take 10 mg by mouth daily.   Yes [provider]  ELIQUIS 5 MG TABS tablet TAKE 1 TABLET BY MOUTH TWICE A DAY 01/03/21  Yes Croitoru, Mihai, MD  sertraline (ZOLOFT) 50 MG tablet Take 50 mg by mouth daily. 07/17/19  Yes [provider]  acetaminophen (TYLENOL) 325 MG tablet Take 1-2 tablets (325-650 mg total) by mouth every 4 (four) hours as needed for mild pain. 06/21/16   Isaiah Serge, NP  aspirin 81 MG EC tablet     [provider]  Cholecalciferol 25 MCG (1000 UT) tablet daily.    [provider]  Cyanocobalamin (VITAMIN B-12 IJ) Inject 1 mg as directed every 30 (thirty) days.     [provider]  donepezil (ARICEPT) 5 MG tablet Take by mouth. 08/19/18   [provider]  Multiple Vitamin (MULTIVITAMIN) tablet Take 1 tablet by mouth daily.    [provider]  pioglitazone-metformin (ACTOPLUS MET) 15-500 MG tablet TAKE 1 TABLET BY MOUTH 2 (TWO)  TIMES DAILY WITH MEALS. 03/20/16   [provider]  pioglitazone-metformin (ACTOPLUS MET) 15-500 MG tablet Take by mouth. 05/26/19   [provider]  rivastigmine (EXELON) 4.6 mg/24hr 4.6 mg daily. 08/01/19   [provider]    Family History No family history on file.  Social History Social History   Tobacco Use   Smoking status: Former    Packs/day: 1.00    Years: 8.00    Pack years: 8.00    Types: Cigarettes    Quit date: 10/15/1956    Years since quitting: 64.4   Smokeless tobacco: Never  Substance Use Topics   Alcohol use: Yes     Alcohol/week: 14.0 standard drinks    Types: 14 Glasses of wine per week    Comment: 06/20/2016 "2 glasses of wine q night"   Drug use: No     Allergies   Patient has no known allergies.   Review of Systems Review of Systems  Constitutional:  Negative for chills and fever.  HENT:  Positive for hearing loss. Negative for ear discharge, ear pain and sore throat.   Respiratory:  Negative for cough and shortness of breath.   Cardiovascular:  Negative for chest pain and palpitations.  Gastrointestinal:  Negative for abdominal pain and vomiting.  Skin:  Negative for color change and rash.  All other systems reviewed and are negative.   Physical Exam Triage Vital Signs ED Triage Vitals  Enc Vitals Group     BP      Pulse      Resp      Temp      Temp src      SpO2      Weight      Height      Head Circumference      Peak Flow      Pain Score      Pain Loc      Pain Edu?      Excl. in [place]?    No data found.  Updated Vital Signs BP 99/62 (BP Location: Left Arm)   Pulse 82   Temp 97.8 F (36.6 C) (Oral)   Resp 18   SpO2 96%   Visual Acuity Right Eye Distance:   Left Eye Distance:   Bilateral Distance:    Right Eye Near:   Left Eye Near:    Bilateral Near:     Physical Exam Vitals and nursing note reviewed.  Constitutional:      General: He is not in acute distress.    Appearance: He is well-developed. He is not ill-appearing.  HENT:     Head: Normocephalic and atraumatic.     Right Ear: There is impacted cerumen.     Left Ear: There is impacted cerumen.     Mouth/Throat:     Mouth: Mucous membranes are moist.     Pharynx: Oropharynx is clear.  Eyes:     Conjunctiva/sclera: Conjunctivae normal.  Cardiovascular:     Rate and Rhythm: Normal rate and regular rhythm.     Heart sounds: Normal heart sounds.  Pulmonary:     Effort: Pulmonary effort is normal. No respiratory distress.     Breath sounds: Normal breath sounds.  Abdominal:     Palpations:  Abdomen is soft.     Tenderness: There is no abdominal tenderness.  Musculoskeletal:     Cervical back: Neck supple.  Skin:    General: Skin is warm and dry.  Neurological:  Mental Status: He is alert. Mental status is at baseline.  Psychiatric:        Mood and Affect: Mood normal.        Behavior: Behavior normal.     UC Treatments / Results  Labs (all labs ordered are listed, but only abnormal results are displayed) Labs Reviewed - No data to display  EKG   Radiology No results found.  Procedures Procedures (including critical care time)  Medications Ordered in UC Medications - No data to display  Initial Impression / Assessment and Plan / UC Course  I have reviewed the triage vital signs and the nursing notes.  Pertinent labs & imaging results that were available during my care of the patient were reviewed by me and considered in my medical decision making (see chart for details).  Bilateral impacted cerumen.  Unable to remove cerumen today.  Discussed with patient and his daughter that he should follow-up with ENT for cerumen impaction removal.  Daughter states she has an ENT that she and her mother see; she will schedule an appointment.  Patient and daughter agree to plan of care.   Final Clinical Impressions(s) / UC Diagnoses   Final diagnoses:  Bilateral impacted cerumen     Discharge Instructions      See an ENT for removal of ear wax.         ED Prescriptions   None    PDMP not reviewed this encounter.   Sharion Balloon, NP 03/24/21 [phone]

## 2021-03-24 NOTE — Discharge Instructions (Addendum)
See an ENT for removal of ear wax.

## 2021-04-25 ENCOUNTER — Other Ambulatory Visit: Payer: Self-pay

## 2021-04-25 ENCOUNTER — Encounter: Payer: Self-pay | Admitting: Cardiovascular Disease

## 2021-04-25 ENCOUNTER — Ambulatory Visit (INDEPENDENT_AMBULATORY_CARE_PROVIDER_SITE_OTHER): Payer: PPO | Admitting: Cardiovascular Disease

## 2021-04-25 VITALS — BP 102/58 | HR 66 | Ht 67.0 in | Wt 148.6 lb

## 2021-04-25 DIAGNOSIS — Z950 Presence of cardiac pacemaker: Secondary | ICD-10-CM

## 2021-04-25 DIAGNOSIS — E118 Type 2 diabetes mellitus with unspecified complications: Secondary | ICD-10-CM | POA: Diagnosis not present

## 2021-04-25 DIAGNOSIS — I2510 Atherosclerotic heart disease of native coronary artery without angina pectoris: Secondary | ICD-10-CM

## 2021-04-25 DIAGNOSIS — E7800 Pure hypercholesterolemia, unspecified: Secondary | ICD-10-CM | POA: Diagnosis not present

## 2021-04-25 DIAGNOSIS — I4891 Unspecified atrial fibrillation: Secondary | ICD-10-CM

## 2021-04-25 DIAGNOSIS — Z7901 Long term (current) use of anticoagulants: Secondary | ICD-10-CM

## 2021-04-25 DIAGNOSIS — H6123 Impacted cerumen, bilateral: Secondary | ICD-10-CM | POA: Diagnosis not present

## 2021-04-25 DIAGNOSIS — I10 Essential (primary) hypertension: Secondary | ICD-10-CM

## 2021-04-25 DIAGNOSIS — R1314 Dysphagia, pharyngoesophageal phase: Secondary | ICD-10-CM | POA: Diagnosis not present

## 2021-04-25 MED ORDER — AMLODIPINE BESYLATE 2.5 MG PO TABS: 2.5000 mg | ORAL_TABLET | Freq: Every day | ORAL | 3 refills | Status: AC

## 2021-04-25 NOTE — Patient Instructions (Signed)
Medication Instructions:  DECREASE the Amlodipine to 2.5 mg once daily STOP the Aspirin (if you have been taking it since you are on Eliquis) *If you need a refill on your cardiac medications before your next appointment, please call your pharmacy*   Lab Work: None ordered If you have labs (blood work) drawn today and your tests are completely normal, you will receive your results only by: [place] (if you have MyChart) OR A paper copy in the mail If you have any lab test that is abnormal or we need to change your treatment, we will call you to review the results.   Testing/Procedures: None ordered   Follow-Up: At [facility], you and your health needs are our priority.  As part of our continuing mission to provide you with exceptional heart care, we have created designated Provider Care Teams.  These Care Teams include your primary Cardiologist (physician) and Advanced Practice Providers (APPs -  Physician Assistants and Nurse Practitioners) who all work together to provide you with the care you need, when you need it.  We recommend signing up for the patient portal called "MyChart".  Sign up information is provided on this After Visit Summary.  MyChart is used to connect with patients for Virtual Visits (Telemedicine).  Patients are able to view lab/test results, encounter notes, upcoming appointments, etc.  Non-urgent messages can be sent to your provider as well.   To learn more about what you can do with MyChart, go to [email or web address].    Your next appointment:   12 month(s)  The format for your next appointment:   In Person  Provider:   Sanda Klein, MD

## 2021-04-25 NOTE — Progress Notes (Signed)
Patient ID: Steve Lloyd, male   DOB: 04/07/1930, 87 y.o.   MRN: [id number]    Cardiology Office Note    Date:  04/25/2021   ID:  Steve Lloyd, Steve Lloyd 1930-08-27, MRN [id number]  PCP:  Steve Pacific, MD  Cardiologist:   Steve Klein, MD   No chief complaint on file. CAD s/p CABG  History of Present Illness:  Steve Lloyd is a 87 y.o. male with CAD s/p coronary bypass surgery in 1998 (single-vessel LIMA to LAD) and has not had recurrent coronary problems since that time.   He has permanent atrial fibrillation with slow ventricular response, on warfarin anticoagulation, s/p single-chamber permanent pacemaker ([employer] [place] [id number]) in September 2017.    He has no complaints, but his memory is clearly deteriorating.  He does not eat a lot, but he claims that his appetite is fine.  He is able to climb a flight of stairs without stopping to catch his breath and without chest discomfort.  He reports that he enjoys playing golf, but his wife points out that he has not played golf in 2 or 3 years.  He has not had any falls, injuries or bleeding problems.  He is taking both aspirin and Eliquis.  Device interrogation shows normal function. The device is programmed VVIR with a lower rate limit of 60 bpm.  Estimated generator longevity is 3.5 years.  He has 99.3% ventricular pacing.  The heart rate histogram is favorable and there have been no episodes of high ventricular rate.  All lead parameters are great.  He is not pacemaker dependent.  He does have underlying atrial fibrillation with slow ventricular rate at about 40-44 bpm.  Steve Lloyd has lost a lot of weight.  His BMI is now within normal range at 23.  His blood pressure is substantially lower, 102/58 today.  Glucose control has been excellent and his lipid profile is great.  His new primary care provider is Dr. Garlon Lloyd with the geriatric service at [facility].  He had labs checked in January with excellent lipid profile  (LDL 65), normal creatinine and potassium levels.  Most recent hemoglobin A1c from October 2021 was quite low at 5.3%.  He is still receiving pioglitazone and metformin.  He is overweight, has diabetes mellitus on oral medications. He takes a statin for hyperlipidemia.   Past Medical History:  Diagnosis Date   Atrial fibrillation with slow ventricular response ([place])    hx/notes 06/20/2016   Coronary artery disease    Hyperlipidemia    Kidney stones    Pneumonia ~ 1950   Presence of permanent cardiac pacemaker    S/P cardiac pacemaker procedure, [employer] 06/20/16  06/21/2016   Type II diabetes mellitus ([place])     Past Surgical History:  Procedure Laterality Date   APPENDECTOMY     CATARACT EXTRACTION W/ INTRAOCULAR LENS  IMPLANT, BILATERAL Bilateral    CORONARY ARTERY BYPASS GRAFT  1998   single-vessel LIMA to LAD/notes 06/20/2016   EP IMPLANTABLE DEVICE N/A 06/20/2016   Procedure: Pacemaker Implant;  Surgeon: Steve Klein, MD;  Location: [facility];  Service: Cardiovascular;  Laterality: N/A;   INGUINAL HERNIA REPAIR Right    INSERT / REPLACE / REMOVE PACEMAKER  06/20/2016    Outpatient Medications Prior to Visit  Medication Sig Dispense Refill   atorvastatin (LIPITOR) 10 MG tablet Take 10 mg by mouth daily.     benazepril (LOTENSIN) 10 MG tablet Take 10 mg by mouth daily.  ELIQUIS 5 MG TABS tablet TAKE 1 TABLET BY MOUTH TWICE A DAY 180 tablet 1   sertraline (ZOLOFT) 50 MG tablet Take 50 mg by mouth daily.     Cyanocobalamin (VITAMIN B-12 IJ) Inject 1 mg as directed every 30 (thirty) days.      donepezil (ARICEPT) 5 MG tablet Take by mouth.     Multiple Vitamin (MULTIVITAMIN) tablet Take 1 tablet by mouth daily.     rivastigmine (EXELON) 4.6 mg/24hr 4.6 mg daily.     acetaminophen (TYLENOL) 325 MG tablet Take 1-2 tablets (325-650 mg total) by mouth every 4 (four) hours as needed for mild pain.     amLODipine (NORVASC) 5 MG tablet Take by mouth.     aspirin 81 MG  EC tablet      Cholecalciferol 25 MCG (1000 UT) tablet daily.     pioglitazone-metformin (ACTOPLUS MET) 15-500 MG tablet TAKE 1 TABLET BY MOUTH 2 (TWO) TIMES DAILY WITH MEALS.  1   pioglitazone-metformin (ACTOPLUS MET) 15-500 MG tablet Take by mouth.     No facility-administered medications prior to visit.     Allergies:   Patient has no known allergies.   Social History   Socioeconomic History   Marital status: Married    Spouse name: Not on file   Number of children: Not on file   Years of education: Not on file   Highest education level: Not on file  Occupational History   Not on file  Tobacco Use   Smoking status: Former    Packs/day: 1.00    Years: 8.00    Pack years: 8.00    Types: Cigarettes    Quit date: 10/15/1956    Years since quitting: 64.5   Smokeless tobacco: Never  Substance and Sexual Activity   Alcohol use: Yes    Alcohol/week: 14.0 standard drinks    Types: 14 Glasses of wine per week    Comment: 06/20/2016 "2 glasses of wine q night"   Drug use: No   Sexual activity: Not on file  Other Topics Concern   Not on file  Social History Narrative   Not on file   Social Determinants of Health   Financial Resource Strain: Not on file  Food Insecurity: Not on file  Transportation Needs: Not on file  Physical Activity: Not on file  Stress: Not on file  Social Connections: Not on file     ROS:   Please see the history of present illness.    ROS  All other systems are reviewed and are negative.  PHYSICAL EXAM:   VS:  BP (!) 102/58 (BP Location: Left Arm, Patient Position: Sitting, Cuff Size: Normal)   Pulse 66   Ht 5' 7"  (6.270 m)   Wt 148 lb 9.6 oz (67.4 kg)   SpO2 98%   BMI 23.27 kg/m      General: Alert, oriented x3, no distress, appears healthy, much cleaner than I remember him.  Healthy left subclavian pacemaker site. Head: no evidence of trauma, PERRL, EOMI, no exophtalmos or lid lag, no myxedema, no xanthelasma; normal ears, nose and  oropharynx Neck: normal jugular venous pulsations and no hepatojugular reflux; brisk carotid pulses without delay and no carotid bruits Chest: clear to auscultation, no signs of consolidation by percussion or palpation, normal fremitus, symmetrical and full respiratory excursions Cardiovascular: normal position and quality of the apical impulse, regular rhythm, normal first and paradoxically split second heart sounds, no murmurs, rubs or gallops Abdomen: no tenderness or distention,  no masses by palpation, no abnormal pulsatility or arterial bruits, normal bowel sounds, no hepatosplenomegaly Extremities: no clubbing, cyanosis or edema; 2+ radial, ulnar and brachial pulses bilaterally; 2+ right femoral, posterior tibial and dorsalis pedis pulses; 2+ left femoral, posterior tibial and dorsalis pedis pulses; no subclavian or femoral bruits Neurological: grossly nonfocal Psych: Normal mood and affect    Wt Readings from Last 3 Encounters:  04/25/21 148 lb 9.6 oz (67.4 kg)  12/15/19 170 lb (77.1 kg)  11/06/18 177 lb 6.4 oz (80.5 kg)      Studies/Labs Reviewed:   EKG:  EKG is ordered today.  AFib with 100% V pacing Recent Labs:  Lipid Panel     Component Value Date/Time   CHOL 138 12/15/2019 0855   TRIG 92 12/15/2019 0855   HDL 60 12/15/2019 0855   CHOLHDL 2.3 12/15/2019 0855   LDLCALC 61 12/15/2019 0855    04/25/2018 A1c 6.1% Chol 155, TG 127, LDL 71, HDL 58  08/19/2018 K 4.5, creat 1.0, normal LFTs, Hgb 12.7 (MCV 105.7)  ASSESSMENT:    1. Atrial fibrillation with slow ventricular response ([place])   2. Pacemaker   3. Coronary artery disease involving native coronary artery of native heart without angina pectoris   4. Essential hypertension   5. Hypercholesterolemia   6. Long term current use of anticoagulant therapy   7. Diabetes mellitus type 2 with complications, controlled      PLAN:  In order of problems listed above:  Atrial fibrillation with slow ventricular  response: Permanent arrhythmia with slow ventricular response and virtually 100% ventricular pacing.  He does have a very slow underlying rhythm in the 40s. CHADSVasc 5 (age 9, HTN, CAD, DM), on oral anticoagulant. PPM: Normal device function.  Not pacemaker dependent with very slow escape rhythm at about 40 bpm.  Continue remote downloads every 3 months. CAD s/p CABG: Denies angina.  Stop aspirin since he is on full anticoagulation. HTN: Good control, possibly excessive.  Reduce the dose of amlodipine in half. HLP: On statin. Most recent LDL at target, but > 1 year since checked. Labs today. Eliquis: no bleeding complications.  Stop aspirin due to bleeding risk. DM: Excellent glycemic control with A1c of 5.3% last October.  He can probably stop taking his diabetes medicines or at least stop the pioglitazone in his combination medication.     Medication Adjustments/Labs and Tests Ordered: Current medicines are reviewed at length with the patient today.  Concerns regarding medicines are outlined above.  Medication changes, Labs and Tests ordered today are listed in the Patient Instructions below. Patient Instructions  Medication Instructions:  DECREASE the Amlodipine to 2.5 mg once daily STOP the Aspirin (if you have been taking it since you are on Eliquis) *If you need a refill on your cardiac medications before your next appointment, please call your pharmacy*   Lab Work: None ordered If you have labs (blood work) drawn today and your tests are completely normal, you will receive your results only by: [place] (if you have MyChart) OR A paper copy in the mail If you have any lab test that is abnormal or we need to change your treatment, we will call you to review the results.   Testing/Procedures: None ordered   Follow-Up: At [facility], you and your health needs are our priority.  As part of our continuing mission to provide you with exceptional heart care, we have  created designated Provider Care Teams.  These Care Teams include your primary  Cardiologist (physician) and Advanced Practice Providers (APPs -  Physician Assistants and Nurse Practitioners) who all work together to provide you with the care you need, when you need it.  We recommend signing up for the patient portal called "MyChart".  Sign up information is provided on this After Visit Summary.  MyChart is used to connect with patients for Virtual Visits (Telemedicine).  Patients are able to view lab/test results, encounter notes, upcoming appointments, etc.  Non-urgent messages can be sent to your provider as well.   To learn more about what you can do with MyChart, go to [email or web address].    Your next appointment:   12 month(s)  The format for your next appointment:   In Person  Provider:   Sanda Klein, MD       Signed, Steve Klein, MD  04/25/2021 10:00 AM    [facility] [address], [place], [place]  [address] Phone: [phone]; Fax: [phone]

## 2021-05-11 ENCOUNTER — Other Ambulatory Visit: Payer: Self-pay | Admitting: Cardiovascular Disease

## 2021-05-11 NOTE — Telephone Encounter (Signed)
Prescription refill request for Eliquis received. Indication:afib Last office visit:croitoru 04/25/21 Scr:1.09 11/10/20 Age: 54mWWeight:30.4kg

## 2021-06-07 DIAGNOSIS — D485 Neoplasm of uncertain behavior of skin: Secondary | ICD-10-CM | POA: Diagnosis not present

## 2021-06-07 DIAGNOSIS — L570 Actinic keratosis: Secondary | ICD-10-CM | POA: Diagnosis not present

## 2021-06-07 DIAGNOSIS — Z85828 Personal history of other malignant neoplasm of skin: Secondary | ICD-10-CM | POA: Diagnosis not present

## 2021-06-07 DIAGNOSIS — L718 Other rosacea: Secondary | ICD-10-CM | POA: Diagnosis not present

## 2021-06-07 DIAGNOSIS — X32XXXA Exposure to sunlight, initial encounter: Secondary | ICD-10-CM | POA: Diagnosis not present

## 2021-06-07 DIAGNOSIS — D2262 Melanocytic nevi of left upper limb, including shoulder: Secondary | ICD-10-CM | POA: Diagnosis not present

## 2021-06-07 DIAGNOSIS — D225 Melanocytic nevi of trunk: Secondary | ICD-10-CM | POA: Diagnosis not present

## 2021-06-07 DIAGNOSIS — C4440 Unspecified malignant neoplasm of skin of scalp and neck: Secondary | ICD-10-CM | POA: Diagnosis not present

## 2021-06-10 DIAGNOSIS — F0391 Unspecified dementia with behavioral disturbance: Secondary | ICD-10-CM | POA: Diagnosis not present

## 2021-06-10 DIAGNOSIS — I10 Essential (primary) hypertension: Secondary | ICD-10-CM | POA: Diagnosis not present

## 2021-06-10 DIAGNOSIS — D649 Anemia, unspecified: Secondary | ICD-10-CM | POA: Diagnosis not present

## 2021-06-10 DIAGNOSIS — R634 Abnormal weight loss: Secondary | ICD-10-CM | POA: Diagnosis not present

## 2021-06-10 DIAGNOSIS — J84112 Idiopathic pulmonary fibrosis: Secondary | ICD-10-CM | POA: Diagnosis not present

## 2021-07-12 ENCOUNTER — Other Ambulatory Visit: Payer: Self-pay

## 2021-07-12 ENCOUNTER — Emergency Department: Payer: PPO

## 2021-07-12 ENCOUNTER — Inpatient Hospital Stay (HOSPITAL_COMMUNITY)
Admit: 2021-07-12 | Discharge: 2021-07-12 | Disposition: A | Discharge status: Home / Self Care | Payer: PPO | Attending: Internal Medicine | Admitting: Internal Medicine

## 2021-07-12 ENCOUNTER — Inpatient Hospital Stay
Admission: EM | Admit: 2021-07-12 | Discharge: 2021-08-16 | DRG: 177 | Disposition: E | Payer: PPO | Attending: Internal Medicine | Admitting: Internal Medicine

## 2021-07-12 DIAGNOSIS — D631 Anemia in chronic kidney disease: Secondary | ICD-10-CM | POA: Diagnosis present

## 2021-07-12 DIAGNOSIS — R296 Repeated falls: Secondary | ICD-10-CM | POA: Diagnosis present

## 2021-07-12 DIAGNOSIS — E876 Hypokalemia: Secondary | ICD-10-CM | POA: Diagnosis not present

## 2021-07-12 DIAGNOSIS — J9601 Acute respiratory failure with hypoxia: Secondary | ICD-10-CM | POA: Diagnosis present

## 2021-07-12 DIAGNOSIS — I4891 Unspecified atrial fibrillation: Secondary | ICD-10-CM | POA: Diagnosis not present

## 2021-07-12 DIAGNOSIS — F03C0 Unspecified dementia, severe, without behavioral disturbance, psychotic disturbance, mood disturbance, and anxiety: Secondary | ICD-10-CM | POA: Diagnosis not present

## 2021-07-12 DIAGNOSIS — Y92009 Unspecified place in unspecified non-institutional (private) residence as the place of occurrence of the external cause: Secondary | ICD-10-CM

## 2021-07-12 DIAGNOSIS — D649 Anemia, unspecified: Secondary | ICD-10-CM | POA: Diagnosis present

## 2021-07-12 DIAGNOSIS — I5041 Acute combined systolic (congestive) and diastolic (congestive) heart failure: Secondary | ICD-10-CM | POA: Diagnosis not present

## 2021-07-12 DIAGNOSIS — B957 Other staphylococcus as the cause of diseases classified elsewhere: Secondary | ICD-10-CM | POA: Diagnosis present

## 2021-07-12 DIAGNOSIS — I10 Essential (primary) hypertension: Secondary | ICD-10-CM | POA: Diagnosis not present

## 2021-07-12 DIAGNOSIS — Z951 Presence of aortocoronary bypass graft: Secondary | ICD-10-CM | POA: Diagnosis not present

## 2021-07-12 DIAGNOSIS — I517 Cardiomegaly: Secondary | ICD-10-CM | POA: Diagnosis not present

## 2021-07-12 DIAGNOSIS — F0390 Unspecified dementia without behavioral disturbance: Secondary | ICD-10-CM | POA: Diagnosis present

## 2021-07-12 DIAGNOSIS — F0391 Unspecified dementia with behavioral disturbance: Secondary | ICD-10-CM | POA: Diagnosis not present

## 2021-07-12 DIAGNOSIS — N183 Chronic kidney disease, stage 3 unspecified: Secondary | ICD-10-CM | POA: Diagnosis present

## 2021-07-12 DIAGNOSIS — W19XXXA Unspecified fall, initial encounter: Secondary | ICD-10-CM | POA: Diagnosis not present

## 2021-07-12 DIAGNOSIS — D509 Iron deficiency anemia, unspecified: Secondary | ICD-10-CM | POA: Diagnosis not present

## 2021-07-12 DIAGNOSIS — J189 Pneumonia, unspecified organism: Secondary | ICD-10-CM | POA: Diagnosis not present

## 2021-07-12 DIAGNOSIS — R0689 Other abnormalities of breathing: Secondary | ICD-10-CM | POA: Diagnosis not present

## 2021-07-12 DIAGNOSIS — E7800 Pure hypercholesterolemia, unspecified: Secondary | ICD-10-CM | POA: Diagnosis not present

## 2021-07-12 DIAGNOSIS — I083 Combined rheumatic disorders of mitral, aortic and tricuspid valves: Secondary | ICD-10-CM | POA: Diagnosis not present

## 2021-07-12 DIAGNOSIS — I429 Cardiomyopathy, unspecified: Secondary | ICD-10-CM | POA: Diagnosis not present

## 2021-07-12 DIAGNOSIS — J811 Chronic pulmonary edema: Secondary | ICD-10-CM | POA: Diagnosis not present

## 2021-07-12 DIAGNOSIS — G9341 Metabolic encephalopathy: Secondary | ICD-10-CM | POA: Diagnosis present

## 2021-07-12 DIAGNOSIS — I5021 Acute systolic (congestive) heart failure: Secondary | ICD-10-CM | POA: Diagnosis not present

## 2021-07-12 DIAGNOSIS — S199XXA Unspecified injury of neck, initial encounter: Secondary | ICD-10-CM | POA: Diagnosis not present

## 2021-07-12 DIAGNOSIS — E1122 Type 2 diabetes mellitus with diabetic chronic kidney disease: Secondary | ICD-10-CM | POA: Diagnosis present

## 2021-07-12 DIAGNOSIS — Z87891 Personal history of nicotine dependence: Secondary | ICD-10-CM

## 2021-07-12 DIAGNOSIS — R410 Disorientation, unspecified: Secondary | ICD-10-CM

## 2021-07-12 DIAGNOSIS — I959 Hypotension, unspecified: Secondary | ICD-10-CM | POA: Diagnosis not present

## 2021-07-12 DIAGNOSIS — J690 Pneumonitis due to inhalation of food and vomit: Secondary | ICD-10-CM | POA: Diagnosis not present

## 2021-07-12 DIAGNOSIS — R197 Diarrhea, unspecified: Secondary | ICD-10-CM | POA: Diagnosis present

## 2021-07-12 DIAGNOSIS — Z20822 Contact with and (suspected) exposure to covid-19: Secondary | ICD-10-CM | POA: Diagnosis present

## 2021-07-12 DIAGNOSIS — E875 Hyperkalemia: Secondary | ICD-10-CM | POA: Diagnosis present

## 2021-07-12 DIAGNOSIS — R7881 Bacteremia: Secondary | ICD-10-CM | POA: Diagnosis not present

## 2021-07-12 DIAGNOSIS — Z66 Do not resuscitate: Secondary | ICD-10-CM | POA: Diagnosis present

## 2021-07-12 DIAGNOSIS — I482 Chronic atrial fibrillation, unspecified: Secondary | ICD-10-CM | POA: Diagnosis present

## 2021-07-12 DIAGNOSIS — B952 Enterococcus as the cause of diseases classified elsewhere: Secondary | ICD-10-CM | POA: Diagnosis not present

## 2021-07-12 DIAGNOSIS — I130 Hypertensive heart and chronic kidney disease with heart failure and stage 1 through stage 4 chronic kidney disease, or unspecified chronic kidney disease: Secondary | ICD-10-CM | POA: Diagnosis present

## 2021-07-12 DIAGNOSIS — I4821 Permanent atrial fibrillation: Secondary | ICD-10-CM | POA: Diagnosis not present

## 2021-07-12 DIAGNOSIS — E1129 Type 2 diabetes mellitus with other diabetic kidney complication: Secondary | ICD-10-CM | POA: Diagnosis present

## 2021-07-12 DIAGNOSIS — Z950 Presence of cardiac pacemaker: Secondary | ICD-10-CM | POA: Diagnosis not present

## 2021-07-12 DIAGNOSIS — I5031 Acute diastolic (congestive) heart failure: Secondary | ICD-10-CM

## 2021-07-12 DIAGNOSIS — I2510 Atherosclerotic heart disease of native coronary artery without angina pectoris: Secondary | ICD-10-CM | POA: Diagnosis not present

## 2021-07-12 DIAGNOSIS — Z79899 Other long term (current) drug therapy: Secondary | ICD-10-CM

## 2021-07-12 DIAGNOSIS — I255 Ischemic cardiomyopathy: Secondary | ICD-10-CM | POA: Diagnosis present

## 2021-07-12 DIAGNOSIS — I272 Pulmonary hypertension, unspecified: Secondary | ICD-10-CM | POA: Diagnosis present

## 2021-07-12 DIAGNOSIS — R778 Other specified abnormalities of plasma proteins: Secondary | ICD-10-CM | POA: Diagnosis present

## 2021-07-12 DIAGNOSIS — R4182 Altered mental status, unspecified: Secondary | ICD-10-CM | POA: Diagnosis not present

## 2021-07-12 DIAGNOSIS — Z7901 Long term (current) use of anticoagulants: Secondary | ICD-10-CM

## 2021-07-12 DIAGNOSIS — I6782 Cerebral ischemia: Secondary | ICD-10-CM | POA: Diagnosis not present

## 2021-07-12 DIAGNOSIS — R1310 Dysphagia, unspecified: Secondary | ICD-10-CM | POA: Diagnosis present

## 2021-07-12 DIAGNOSIS — R0600 Dyspnea, unspecified: Secondary | ICD-10-CM

## 2021-07-12 DIAGNOSIS — J180 Bronchopneumonia, unspecified organism: Secondary | ICD-10-CM | POA: Diagnosis not present

## 2021-07-12 DIAGNOSIS — N1831 Chronic kidney disease, stage 3a: Secondary | ICD-10-CM | POA: Diagnosis present

## 2021-07-12 DIAGNOSIS — R0902 Hypoxemia: Secondary | ICD-10-CM | POA: Diagnosis not present

## 2021-07-12 LAB — BLOOD CULTURE ID PANEL (REFLEXED) - BCID2
A.calcoaceticus-baumannii: NOT DETECTED
Bacteroides fragilis: NOT DETECTED
Candida albicans: NOT DETECTED
Candida auris: NOT DETECTED
Candida glabrata: NOT DETECTED
Candida krusei: NOT DETECTED
Candida parapsilosis: NOT DETECTED
Candida tropicalis: NOT DETECTED
Cryptococcus neoformans/gattii: NOT DETECTED
Enterobacter cloacae complex: NOT DETECTED
Enterobacterales: NOT DETECTED
Enterococcus Faecium: NOT DETECTED
Enterococcus faecalis: DETECTED — AB
Escherichia coli: NOT DETECTED
Haemophilus influenzae: NOT DETECTED
Klebsiella aerogenes: NOT DETECTED
Klebsiella oxytoca: NOT DETECTED
Klebsiella pneumoniae: NOT DETECTED
Listeria monocytogenes: NOT DETECTED
Methicillin resistance mecA/C: DETECTED — AB
Neisseria meningitidis: NOT DETECTED
Proteus species: NOT DETECTED
Pseudomonas aeruginosa: NOT DETECTED
Salmonella species: NOT DETECTED
Serratia marcescens: NOT DETECTED
Staphylococcus aureus (BCID): NOT DETECTED
Staphylococcus epidermidis: DETECTED — AB
Staphylococcus lugdunensis: NOT DETECTED
Staphylococcus species: DETECTED — AB
Stenotrophomonas maltophilia: NOT DETECTED
Streptococcus agalactiae: NOT DETECTED
Streptococcus pneumoniae: NOT DETECTED
Streptococcus pyogenes: NOT DETECTED
Streptococcus species: NOT DETECTED
Vancomycin resistance: NOT DETECTED

## 2021-07-12 LAB — COMPREHENSIVE METABOLIC PANEL
ALT: 22 U/L (ref 0–44)
AST: 30 U/L (ref 15–41)
Albumin: 2.9 g/dL — ABNORMAL LOW (ref 3.5–5.0)
Alkaline Phosphatase: 57 U/L (ref 38–126)
Anion gap: 13 (ref 5–15)
BUN: 31 mg/dL — ABNORMAL HIGH (ref 8–23)
CO2: 21 mmol/L — ABNORMAL LOW (ref 22–32)
Calcium: 8.5 mg/dL — ABNORMAL LOW (ref 8.9–10.3)
Chloride: 101 mmol/L (ref 98–111)
Creatinine, Ser: 1.26 mg/dL — ABNORMAL HIGH (ref 0.61–1.24)
GFR, Estimated: 55 mL/min — ABNORMAL LOW (ref >60)
Glucose, Bld: 139 mg/dL — ABNORMAL HIGH (ref 70–99)
Potassium: 3.4 mmol/L — ABNORMAL LOW (ref 3.5–5.1)
Sodium: 135 mmol/L (ref 135–145)
Total Bilirubin: 1.2 mg/dL (ref 0.3–1.2)
Total Protein: 6.1 g/dL — ABNORMAL LOW (ref 6.5–8.1)

## 2021-07-12 LAB — CBC WITH DIFFERENTIAL/PLATELET
Abs Immature Granulocytes: 0.07 10*3/uL (ref 0.00–0.07)
Basophils Absolute: 0 10*3/uL (ref 0.0–0.1)
Basophils Relative: 0 %
Eosinophils Absolute: 0.1 10*3/uL (ref 0.0–0.5)
Eosinophils Relative: 1 %
HCT: 26.8 % — ABNORMAL LOW (ref 39.0–52.0)
Hemoglobin: 9.1 g/dL — ABNORMAL LOW (ref 13.0–17.0)
Immature Granulocytes: 1 %
Lymphocytes Relative: 11 %
Lymphs Abs: 0.9 10*3/uL (ref 0.7–4.0)
MCH: 32.2 pg (ref 26.0–34.0)
MCHC: 34 g/dL (ref 30.0–36.0)
MCV: 94.7 fL (ref 80.0–100.0)
Monocytes Absolute: 0.9 10*3/uL (ref 0.1–1.0)
Monocytes Relative: 11 %
Neutro Abs: 6.5 10*3/uL (ref 1.7–7.7)
Neutrophils Relative %: 76 %
Platelets: 176 10*3/uL (ref 150–400)
RBC: 2.83 MIL/uL — ABNORMAL LOW (ref 4.22–5.81)
RDW: 15.3 % (ref 11.5–15.5)
WBC: 8.5 10*3/uL (ref 4.0–10.5)
nRBC: 0 % (ref 0.0–0.2)

## 2021-07-12 LAB — URINALYSIS, COMPLETE (UACMP) WITH MICROSCOPIC
Bacteria, UA: NONE SEEN
Bilirubin Urine: NEGATIVE
Glucose, UA: NEGATIVE mg/dL
Hgb urine dipstick: NEGATIVE
Ketones, ur: NEGATIVE mg/dL
Leukocytes,Ua: NEGATIVE
Nitrite: NEGATIVE
Protein, ur: NEGATIVE mg/dL
Specific Gravity, Urine: 1.02 (ref 1.005–1.030)
Squamous Epithelial / HPF: NONE SEEN (ref 0–5)
pH: 5 (ref 5.0–8.0)

## 2021-07-12 LAB — PROTIME-INR
INR: 1.7 — ABNORMAL HIGH (ref 0.8–1.2)
Prothrombin Time: 19.9 seconds — ABNORMAL HIGH (ref 11.4–15.2)

## 2021-07-12 LAB — HEMOGLOBIN AND HEMATOCRIT, BLOOD
HCT: 26.4 % — ABNORMAL LOW (ref 39.0–52.0)
Hemoglobin: 8.8 g/dL — ABNORMAL LOW (ref 13.0–17.0)

## 2021-07-12 LAB — BRAIN NATRIURETIC PEPTIDE: B Natriuretic Peptide: 849.7 pg/mL — ABNORMAL HIGH (ref 0.0–100.0)

## 2021-07-12 LAB — MAGNESIUM: Magnesium: 2 mg/dL (ref 1.7–2.4)

## 2021-07-12 LAB — HEMOGLOBIN A1C
Hgb A1c MFr Bld: 6.1 % — ABNORMAL HIGH (ref 4.8–5.6)
Mean Plasma Glucose: 128 mg/dL

## 2021-07-12 LAB — RESP PANEL BY RT-PCR (FLU A&B, COVID) ARPGX2
Influenza A by PCR: NEGATIVE
Influenza B by PCR: NEGATIVE
SARS Coronavirus 2 by RT PCR: NEGATIVE

## 2021-07-12 LAB — LIPASE, BLOOD: Lipase: 38 U/L (ref 11–51)

## 2021-07-12 LAB — TROPONIN I (HIGH SENSITIVITY)
Troponin I (High Sensitivity): 355 ng/L (ref <18)
Troponin I (High Sensitivity): 50 ng/L — ABNORMAL HIGH (ref <18)
Troponin I (High Sensitivity): 626 ng/L (ref <18)
Troponin I (High Sensitivity): 101 ng/L (ref <18)
Troponin I (High Sensitivity): 731 ng/L (ref <18)
Troponin I (High Sensitivity): 764 ng/L (ref <18)

## 2021-07-12 LAB — APTT: aPTT: 37 seconds — ABNORMAL HIGH (ref 24–36)

## 2021-07-12 LAB — LACTIC ACID, PLASMA
Lactic Acid, Venous: 1.8 mmol/L (ref 0.5–1.9)
Lactic Acid, Venous: 0.9 mmol/L (ref 0.5–1.9)

## 2021-07-12 LAB — PROCALCITONIN: Procalcitonin: 0.11 ng/mL

## 2021-07-12 MED ORDER — VANCOMYCIN HCL 1500 MG/300ML IV SOLN
1500.0000 mg | Freq: Once | INTRAVENOUS | Status: AC
  Administered 2021-07-12: 1500 mg via INTRAVENOUS
  Filled 2021-07-12: qty 300

## 2021-07-12 MED ORDER — BENAZEPRIL HCL 10 MG PO TABS
10.0000 mg | ORAL_TABLET | Freq: Every day | ORAL | Status: DC
  Filled 2021-07-12 (×2): qty 1

## 2021-07-12 MED ORDER — ATORVASTATIN CALCIUM 20 MG PO TABS
10.0000 mg | ORAL_TABLET | Freq: Every day | ORAL | Status: DC
  Administered 2021-07-13 – 2021-07-15 (×3): 10 mg via ORAL
  Filled 2021-07-12 (×3): qty 1

## 2021-07-12 MED ORDER — DM-GUAIFENESIN ER 30-600 MG PO TB12: 1.0000 | ORAL_TABLET | Freq: Two times a day (BID) | ORAL | Status: DC | PRN

## 2021-07-12 MED ORDER — ALBUTEROL SULFATE (2.5 MG/3ML) 0.083% IN NEBU
2.5000 mg | INHALATION_SOLUTION | RESPIRATORY_TRACT | Status: DC | PRN
  Filled 2021-07-12: qty 3

## 2021-07-12 MED ORDER — SODIUM CHLORIDE 0.9 % IV SOLN
500.0000 mg | INTRAVENOUS | Status: DC
  Administered 2021-07-12 – 2021-07-15 (×4): 500 mg via INTRAVENOUS
  Filled 2021-07-12 (×5): qty 500

## 2021-07-12 MED ORDER — POTASSIUM CHLORIDE CRYS ER 20 MEQ PO TBCR
40.0000 meq | EXTENDED_RELEASE_TABLET | Freq: Once | ORAL | Status: AC
  Administered 2021-07-12: 40 meq via ORAL
  Filled 2021-07-12: qty 2

## 2021-07-12 MED ORDER — SODIUM CHLORIDE 0.9 % IV BOLUS (SEPSIS)
500.0000 mL | Freq: Once | INTRAVENOUS | Status: AC
  Administered 2021-07-12: 500 mL via INTRAVENOUS

## 2021-07-12 MED ORDER — RIVASTIGMINE 4.6 MG/24HR TD PT24
4.6000 mg | MEDICATED_PATCH | Freq: Every day | TRANSDERMAL | Status: DC
  Administered 2021-07-13 – 2021-07-15 (×3): 4.6 mg via TRANSDERMAL
  Filled 2021-07-12 (×6): qty 1

## 2021-07-12 MED ORDER — OLANZAPINE 5 MG PO TBDP
2.5000 mg | ORAL_TABLET | Freq: Four times a day (QID) | ORAL | Status: DC | PRN
  Administered 2021-07-13 – 2021-07-15 (×5): 2.5 mg via ORAL
  Filled 2021-07-12: qty 1
  Filled 2021-07-12 (×5): qty 0.5

## 2021-07-12 MED ORDER — ACETAMINOPHEN 325 MG PO TABS: 650.0000 mg | ORAL_TABLET | Freq: Four times a day (QID) | ORAL | Status: DC | PRN

## 2021-07-12 MED ORDER — VANCOMYCIN HCL 1750 MG/350ML IV SOLN
1750.0000 mg | INTRAVENOUS | Status: DC
  Filled 2021-07-12: qty 350

## 2021-07-12 MED ORDER — HYDRALAZINE HCL 20 MG/ML IJ SOLN: 5.0000 mg | INTRAMUSCULAR | Status: DC | PRN

## 2021-07-12 MED ORDER — SODIUM CHLORIDE 0.9 % IV SOLN
2.0000 g | INTRAVENOUS | Status: DC
  Administered 2021-07-12: 2 g via INTRAVENOUS
  Filled 2021-07-12: qty 20

## 2021-07-12 MED ORDER — SERTRALINE HCL 50 MG PO TABS
50.0000 mg | ORAL_TABLET | Freq: Every day | ORAL | Status: DC
  Administered 2021-07-12 – 2021-07-15 (×4): 50 mg via ORAL
  Filled 2021-07-12 (×4): qty 1

## 2021-07-12 MED ORDER — ONDANSETRON HCL 4 MG/2ML IJ SOLN: 4.0000 mg | Freq: Three times a day (TID) | INTRAMUSCULAR | Status: DC | PRN

## 2021-07-12 NOTE — ED Notes (Signed)
This RN called into room d/t pt attempting to climb out of bed. With the help of English as a second language teacher, this RN got this pt back into bed, got rid of excess sheets on bed, reconnected pt to monitoring equipment, covered pt back up, placed bed in lowest setting, both side rails up, doors open so pt is visible from RN station, and placed call bell within reach.

## 2021-07-12 NOTE — H&P (Addendum)
History and Physical    Steve Lloyd MGQ:[id number] DOB: March 17, 1930 DOA: 06/27/2021  Referring MD/NP/PA:   PCP: Everardo Pacific, MD   Patient coming from:  The patient is coming from home.    Chief Complaint: fall, AMS and cough  HPI: Steve Lloyd is a 87 y.o. male with medical history significant of hypertension, hyperlipidemia, diabetes mellitus, pacemaker placement, CAD, CABG, atrial fibrillation on Eliquis, kidney stone, dementia, CKD stage IIIa, who presents with fall, altered mental status and cough.  Per patient's daughter and wife at the bedside, patient has been more confused and had multiple falls in the past several days.  At his normal baseline, patient recognizes his daughter and wife, but is usually not orientated to the time and place. Patient has dry cough, little shortness of breath, does not seem to have chest pain, fever or chills.  No nausea, vomiting, diarrhea noted.  Per his daughter, patient does not have dark stool or rectal bleeding.  No unilateral numbness or tingling to extremities.  No facial droop or slurred speech.  ED Course: pt was found to have WBC 8.5, BNP 849.7, lactic acid of 1.8, INR 1.7, troponin level 50, 355, 101, 731, negative COVID 19, negative urinalysis, renal function close to baseline, potassium 3.4, temperature 97.2, blood pressure 121/51, heart rate 61, 129, 59, RR 20, oxygen saturation 99% currently (patient had documented oxygen saturation 74% initially, which was inaccurate per nurse report).  Chest x-ray showed bilateral opacity (right side is worse than the left).  CT of head and CT of C-spine negative for acute issues.  Patient is admitted to [facility] bed as inpatient.  Review of Systems: Could not reviewed due to altered mental status.  Allergy: No Known Allergies  Past Medical History:  Diagnosis Date   Atrial fibrillation with slow ventricular response ([place])    hx/notes 06/20/2016   Coronary artery disease     Hyperlipidemia    Kidney stones    Pneumonia ~ 1950   Presence of permanent cardiac pacemaker    S/P cardiac pacemaker procedure, [employer] 06/20/16  06/21/2016   Type II diabetes mellitus ([place])     Past Surgical History:  Procedure Laterality Date   APPENDECTOMY     CATARACT EXTRACTION W/ INTRAOCULAR LENS  IMPLANT, BILATERAL Bilateral    CORONARY ARTERY BYPASS GRAFT  1998   single-vessel LIMA to LAD/notes 06/20/2016   EP IMPLANTABLE DEVICE N/A 06/20/2016   Procedure: Pacemaker Implant;  Surgeon: Sanda Klein, MD;  Location: [facility];  Service: Cardiovascular;  Laterality: N/A;   INGUINAL HERNIA REPAIR Right    INSERT / REPLACE / REMOVE PACEMAKER  06/20/2016    Social History:  reports that he quit smoking about 64 years ago. His smoking use included cigarettes. He has a 8.00 pack-year smoking history. He has never used smokeless tobacco. He reports current alcohol use of about 14.0 standard drinks per week. He reports that he does not use drugs.  Family History: Could not reviewed due to altered mental status.  Prior to Admission medications   Medication Sig Start Date End Date Taking? Authorizing Provider  amLODipine (NORVASC) 2.5 MG tablet Take 1 tablet (2.5 mg total) by mouth daily. 04/25/21   Croitoru, Mihai, MD  atorvastatin (LIPITOR) 10 MG tablet Take 10 mg by mouth daily.    [provider]  benazepril (LOTENSIN) 10 MG tablet Take 10 mg by mouth daily.    [provider]  Cyanocobalamin (VITAMIN B-12 IJ) Inject 1  mg as directed every 30 (thirty) days.     [provider]  donepezil (ARICEPT) 5 MG tablet Take by mouth. 08/19/18   [provider]  ELIQUIS 5 MG TABS tablet TAKE 1 TABLET BY MOUTH TWICE A DAY 05/11/21   Croitoru, Mihai, MD  Multiple Vitamin (MULTIVITAMIN) tablet Take 1 tablet by mouth daily.    [provider]  rivastigmine (EXELON) 4.6 mg/24hr 4.6 mg daily. 08/01/19   [provider]  sertraline  (ZOLOFT) 50 MG tablet Take 50 mg by mouth daily. 07/17/19   [provider]    Physical Exam: Vitals:   07/13/2021 1530 06/25/2021 1600 06/23/2021 1800 06/18/2021 1930  BP: 134/70 103/80 (!) 126/106 125/77  Pulse: 65 61 65 67  Resp: (!) 26 (!) 27 19 20   Temp:      TempSrc:      SpO2: 97% 94% 95% 100%  Weight:      Height:       General: Not in acute distress HEENT:       Eyes: PERRL, EOMI, no scleral icterus.       ENT: No discharge from the ears and nose.       Neck: No JVD, no bruit, no mass felt. Heme: No neck lymph node enlargement. Cardiac: S1/S2, RRR, No murmurs, No gallops or rubs. Respiratory: No rales, wheezing, rhonchi or rubs. GI: Soft, nondistended, nontender, no organomegaly, BS present. GU: No hematuria Ext: no pitting leg edema bilaterally. 1+DP/PT pulse bilaterally. Musculoskeletal: No joint deformities, No joint redness or warmth, no limitation of ROM in spin. Skin: No rashes.  Neuro: Confused, knows his own name, not oriented to time and place. cranial nerves II-XII grossly intact, moves all extremities Psych: Patient is not psychotic, no suicidal or hemocidal ideation.  Labs on Admission: I have personally reviewed following labs and imaging studies  CBC: Recent Labs  Lab 07/02/2021 0616 06/24/2021 0631  WBC 8.5  --   NEUTROABS 6.5  --   HGB 9.1* 8.8*  HCT 26.8* 26.4*  MCV 94.7  --   PLT 176  --    Basic Metabolic Panel: Recent Labs  Lab 06/19/2021 0616 07/09/2021 0832  NA 135  --   K 3.4*  --   CL 101  --   CO2 21*  --   GLUCOSE 139*  --   BUN 31*  --   CREATININE 1.26*  --   CALCIUM 8.5*  --   MG  --  2.0   GFR: Estimated Creatinine Clearance: 37.2 mL/min (A) (by C-G formula based on SCr of 1.26 mg/dL (H)). Liver Function Tests: Recent Labs  Lab 06/17/2021 0616  AST 30  ALT 22  ALKPHOS 57  BILITOT 1.2  PROT 6.1*  ALBUMIN 2.9*   Recent Labs  Lab 07/08/2021 0616  LIPASE 38   No results for input(s): AMMONIA in the last 168  hours. Coagulation Profile: Recent Labs  Lab 06/18/2021 0616  INR 1.7*   Cardiac Enzymes: No results for input(s): CKTOTAL, CKMB, CKMBINDEX, TROPONINI in the last 168 hours. BNP (last 3 results) No results for input(s): PROBNP in the last 8760 hours. HbA1C: No results for input(s): HGBA1C in the last 72 hours. CBG: No results for input(s): GLUCAP in the last 168 hours. Lipid Profile: No results for input(s): CHOL, HDL, LDLCALC, TRIG, CHOLHDL, LDLDIRECT in the last 72 hours. Thyroid Function Tests: No results for input(s): TSH, T4TOTAL, FREET4, T3FREE, THYROIDAB in the last 72 hours. Anemia Panel: No results for  input(s): VITAMINB12, FOLATE, FERRITIN, TIBC, IRON, RETICCTPCT in the last 72 hours. Urine analysis:    Component Value Date/Time   COLORURINE YELLOW (A) 06/21/2021 1200   APPEARANCEUR CLEAR (A) 06/23/2021 1200   LABSPEC 1.020 06/21/2021 1200   PHURINE 5.0 06/19/2021 1200   GLUCOSEU NEGATIVE 07/08/2021 1200   HGBUR NEGATIVE 07/01/2021 1200   BILIRUBINUR NEGATIVE 07/10/2021 1200   KETONESUR NEGATIVE 07/10/2021 1200   PROTEINUR NEGATIVE 06/27/2021 1200   NITRITE NEGATIVE 06/19/2021 1200   LEUKOCYTESUR NEGATIVE 06/29/2021 1200   Sepsis Labs: @LABRCNTIP (procalcitonin:4,lacticidven:4) ) Recent Results (from the past 240 hour(s))  Blood culture (routine single)     Status: None (Preliminary result)   Collection Time: 06/19/2021  6:12 AM   Specimen: BLOOD  Result Value Ref Range Status   Specimen Description BLOOD  LAC  Final   Special Requests   Final    BOTTLES DRAWN AEROBIC AND ANAEROBIC Blood Culture adequate volume   Culture  Setup Time   Final    GRAM POSITIVE COCCI Organism ID to follow CRITICAL RESULT CALLED TO, READ BACK BY AND VERIFIED WITH: Performed at [facility], [address] [place], [place] [address]    Culture PENDING  Incomplete   Report Status PENDING  Incomplete  Resp Panel by RT-PCR (Flu A&B, Covid) Nasopharyngeal Swab     Status:  None   Collection Time: 06/24/2021  6:16 AM   Specimen: Nasopharyngeal Swab; Nasopharyngeal(NP) swabs in vial transport medium  Result Value Ref Range Status   SARS Coronavirus 2 by RT PCR NEGATIVE NEGATIVE Final    Comment: (NOTE) SARS-CoV-2 target nucleic acids are NOT DETECTED.  The SARS-CoV-2 RNA is generally detectable in upper respiratory specimens during the acute phase of infection. The lowest concentration of SARS-CoV-2 viral copies this assay can detect is 138 copies/mL. A negative result does not preclude SARS-Cov-2 infection and should not be used as the sole basis for treatment or other patient management decisions. A negative result may occur with  improper specimen collection/handling, submission of specimen other than nasopharyngeal swab, presence of viral mutation(s) within the areas targeted by this assay, and inadequate number of viral copies(<138 copies/mL). A negative result must be combined with clinical observations, patient history, and epidemiological information. The expected result is Negative.  Fact Sheet for Patients:  EntrepreneurPulse.com.au  Fact Sheet for Healthcare Providers:  IncredibleEmployment.be  This test is no t yet approved or cleared by the [place] FDA and  has been authorized for detection and/or diagnosis of SARS-CoV-2 by FDA under an Emergency Use Authorization (EUA). This EUA will remain  in effect (meaning this test can be used) for the duration of the COVID-19 declaration under Section 564(b)(1) of the Act, 21 U.S.C.section 360bbb-3(b)(1), unless the authorization is terminated  or revoked sooner.       Influenza A by PCR NEGATIVE NEGATIVE Final   Influenza B by PCR NEGATIVE NEGATIVE Final    Comment: (NOTE) The Xpert Xpress SARS-CoV-2/FLU/RSV plus assay is intended as an aid in the diagnosis of influenza from Nasopharyngeal swab specimens and should not be used as a sole basis for  treatment. Nasal washings and aspirates are unacceptable for Xpert Xpress SARS-CoV-2/FLU/RSV testing.  Fact Sheet for Patients: EntrepreneurPulse.com.au  Fact Sheet for Healthcare Providers: IncredibleEmployment.be  This test is not yet approved or cleared by the [place] FDA and has been authorized for detection and/or diagnosis of SARS-CoV-2 by FDA under an Emergency Use Authorization (EUA). This EUA will remain in effect (meaning this test  can be used) for the duration of the COVID-19 declaration under Section 564(b)(1) of the Act, 21 U.S.C. section 360bbb-3(b)(1), unless the authorization is terminated or revoked.  Performed at [facility], [address] [place], [place] [address]      Radiological Exams on Admission: CT HEAD WO CONTRAST (5MM)  Result Date: 07/15/2021 CLINICAL DATA:  Headache, intracranial hemorrhage suspected. Head trauma, minor (Age >= 65y). Mental status change, unknown cause; Neck trauma (Age >= 65y). Multiple falls. EXAM: CT HEAD WITHOUT CONTRAST CT CERVICAL SPINE WITHOUT CONTRAST TECHNIQUE: Multidetector CT imaging of the head and cervical spine was performed following the standard protocol without intravenous contrast. Multiplanar CT image reconstructions of the cervical spine were also generated. COMPARISON:  04/21/2019 head CT report from [facility] FINDINGS: CT HEAD FINDINGS Brain: There is no evidence of an acute infarct, acute intracranial hemorrhage, mass, or midline shift. There are small low-density subdural fluid collections over both frontal convexities measuring up to 1 cm in thickness which were also described described in the 2020 CT report favoring chronic hygromas. These result in slight flattening of frontal gyri without other more significant mass effect. There is mild-to-moderate cerebral atrophy. Hypodensities in the cerebral white matter bilaterally are nonspecific but compatible with mild chronic  small vessel ischemic disease. Vascular: Calcified atherosclerosis at the skull base. No hyperdense vessel. Skull: No acute fracture or suspicious osseous lesion. Sinuses/Orbits: Chronic left maxillary sinusitis with complete opacification of the sinus by partially calcified material and prominent osteitis. Clear mastoid air cells. Bilateral cataract extraction. Other: None. CT CERVICAL SPINE FINDINGS Alignment: Trace retrolisthesis of C5 on C6 and trace anterolisthesis of C6 on C7, C7 on T1, and T1 on T2. Skull base and vertebrae: No acute fracture or suspicious osseous lesion. Bulky anterior vertebral ossification from C3-C7 with solid interbody at C5-6 and C6-7. Soft tissues and spinal canal: No prevertebral fluid or swelling. No visible canal hematoma. Disc levels: Multilevel facet arthrosis, most severe on the right at C7-T1. Moderate multilevel neural foraminal stenosis due to uncovertebral and facet spurring. Mild multilevel spinal stenosis. Upper chest: Mild scarring in the lung apices. Other: Moderate calcific atherosclerosis at the carotid bifurcations. Partially visualized pacemaker. IMPRESSION: 1. No evidence of acute intracranial abnormality. 2. Small chronic bilateral subdural hygromas. 3. Mild chronic small vessel ischemic disease and cerebral atrophy. 4. No acute cervical spine fracture. Electronically Signed   By: Logan Bores M.D.   On: 07/07/2021 07:04   CT Cervical Spine Wo Contrast  Result Date: 07/11/2021 CLINICAL DATA:  Headache, intracranial hemorrhage suspected. Head trauma, minor (Age >= 65y). Mental status change, unknown cause; Neck trauma (Age >= 65y). Multiple falls. EXAM: CT HEAD WITHOUT CONTRAST CT CERVICAL SPINE WITHOUT CONTRAST TECHNIQUE: Multidetector CT imaging of the head and cervical spine was performed following the standard protocol without intravenous contrast. Multiplanar CT image reconstructions of the cervical spine were also generated. COMPARISON:  04/21/2019 head CT  report from [facility] FINDINGS: CT HEAD FINDINGS Brain: There is no evidence of an acute infarct, acute intracranial hemorrhage, mass, or midline shift. There are small low-density subdural fluid collections over both frontal convexities measuring up to 1 cm in thickness which were also described described in the 2020 CT report favoring chronic hygromas. These result in slight flattening of frontal gyri without other more significant mass effect. There is mild-to-moderate cerebral atrophy. Hypodensities in the cerebral white matter bilaterally are nonspecific but compatible with mild chronic small vessel ischemic disease. Vascular: Calcified atherosclerosis at the skull base. No hyperdense  vessel. Skull: No acute fracture or suspicious osseous lesion. Sinuses/Orbits: Chronic left maxillary sinusitis with complete opacification of the sinus by partially calcified material and prominent osteitis. Clear mastoid air cells. Bilateral cataract extraction. Other: None. CT CERVICAL SPINE FINDINGS Alignment: Trace retrolisthesis of C5 on C6 and trace anterolisthesis of C6 on C7, C7 on T1, and T1 on T2. Skull base and vertebrae: No acute fracture or suspicious osseous lesion. Bulky anterior vertebral ossification from C3-C7 with solid interbody at C5-6 and C6-7. Soft tissues and spinal canal: No prevertebral fluid or swelling. No visible canal hematoma. Disc levels: Multilevel facet arthrosis, most severe on the right at C7-T1. Moderate multilevel neural foraminal stenosis due to uncovertebral and facet spurring. Mild multilevel spinal stenosis. Upper chest: Mild scarring in the lung apices. Other: Moderate calcific atherosclerosis at the carotid bifurcations. Partially visualized pacemaker. IMPRESSION: 1. No evidence of acute intracranial abnormality. 2. Small chronic bilateral subdural hygromas. 3. Mild chronic small vessel ischemic disease and cerebral atrophy. 4. No acute cervical spine fracture. Electronically Signed   By:  Logan Bores M.D.   On: 06/25/2021 07:04   DG Chest Port 1 View  Result Date: 06/23/2021 CLINICAL DATA:  Questionable sepsis.  Pending labs. EXAM: PORTABLE CHEST 1 VIEW COMPARISON:  06/21/2016 FINDINGS: Indistinct bilateral airspace disease greatest at the bases. No Kerley lines or pleural fluid. Chronic cardiomegaly. Prior median sternotomy and single chamber pacer implant from the left. IMPRESSION: Bilateral airspace disease with history suggesting pneumonia. Electronically Signed   By: Jorje Guild M.D.   On: 06/17/2021 06:49     EKG: I have personally reviewed.  Atrial fibrillation/flutter, QTC 458, occasional paced marks, LAD, T wave inversion in lateral leads and precordial leads.   Assessment/Plan Principal Problem:   CAP (community acquired pneumonia) Active Problems:   CAD s/p CABG LIMA to LAD 1998   Hypercholesterolemia   Essential hypertension   Type II diabetes mellitus with renal manifestations (HCC)   Atrial fibrillation, chronic (HCC)   Dementia (HCC)   CKD (chronic kidney disease), stage IIIa   Hypokalemia   Elevated troponin   Fall at home, initial encounter   Acute metabolic encephalopathy   Normocytic anemia   Possible CAP (community acquired pneumonia): Patient has cough and mild shortness of breath, chest x-ray showed bilateral opacity, indicating possible CAP, but the patient does not have fever or leukocytosis, making this diagnosis questionable.  Another differential diagnosis is acute CHF since patient has elevated BNP 849, but patient does not have leg edema or JVD.  We will get a 2D echo for further evaluation.  - Will admit to med-surg bed as inpt - IV Rocephin and azithromycin - Mucinex for cough  - Bronchodilators - Urine legionella and S. pneumococcal antigen - Follow up blood culture x2, sputum culture - 2d echo  CAD s/p CABG LIMA to LAD 1998 and elevated trop:  trop 50 --> 355 --> 101 --> 731.  Chest pain, possibly due to demand ischemia.   Since patient has worsening anemia, will not start IV heparin. -Continue Lipitor -Treat troponin -Check A1c, FLP -consulted Dr. Johnsie Cancel of card, will see pt in am. Dr.Nishan agreed to hold IV heparin and Eliquis due to concerning of Hgb drop.  Hypercholesterolemia -Lipitor  Essential hypertension -IV hydralazine as needed -Lotensin,  Diet controlled Type II diabetes mellitus with renal manifestations [facility]): Recent A1c 5.7, well controlled. -Check blood sugar every morning  Atrial fibrillation, chronic (HCC) -Hold Eliquis due to worsening anemia -Cardiac monitoring  Dementia (HCC) -  Exelon  CKD (chronic kidney disease), stage IIIa: Stable -Follow-up with BMP  Hypokalemia: Potassium 3.4 -Repleted potassium -Check magnesium level  Fall at home, initial encounter -PT/OT  Acute metabolic encephalopathy: Likely multifactorial etiology.  CT head negative. -Frequent neuro check  Normocytic anemia: Hemoglobin 11.2 on 06/10/2021 --> 9.1.  Patient does not have dark stool or rectal bleeding per family -Hold Eliquis -Check anemia panel and FOBT      DVT ppx: SCd Code Status: DNR per his daughter Family Communication:  Yes, patient's daughter and wife    at bed side Disposition Plan:  Anticipate discharge back to previous environment Consults called: consulted Dr. Johnsie Cancel of card. Admission status and Level of care: Med-Surg:    as inpt     Status is: Inpatient  Remains inpatient appropriate because:Inpatient level of care appropriate due to severity of illness  Dispo: The patient is from: Home              Anticipated d/c is to:  to to determined, likely SNF              Patient currently is not medically stable to d/c.   Difficult to place patient No          Date of Service 07/14/2021    [facility]   If 7PM-7AM, please contact night-coverage [email or web address] 06/19/2021, 8:48 PM

## 2021-07-12 NOTE — Progress Notes (Signed)
PHARMACY - PHYSICIAN COMMUNICATION CRITICAL VALUE ALERT - BLOOD CULTURE IDENTIFICATION (BCID)  Steve Lloyd is an 87 y.o. male who presented to [facility] on 07/05/2021 with a chief complaint of PNA (?)  Assessment:  Staph Epi (Mec A +) and E. Faecalis in 1 of 4 bottles  (include suspected source if known)  Name of physician (or Provider) Contacted: [place]  Current antibiotics: ceftriaxone and azithromycin   Changes to prescribed antibiotics recommended:  D/C ceftriaxone and start Vancomycin  Results for orders placed or performed during the hospital encounter of 06/24/2021  Blood Culture ID Panel (Reflexed) (Collected: 06/25/2021  6:12 AM)  Result Value Ref Range   Enterococcus faecalis DETECTED (A) NOT DETECTED   Enterococcus Faecium NOT DETECTED NOT DETECTED   Listeria monocytogenes NOT DETECTED NOT DETECTED   Staphylococcus species DETECTED (A) NOT DETECTED   Staphylococcus aureus (BCID) NOT DETECTED NOT DETECTED   Staphylococcus epidermidis DETECTED (A) NOT DETECTED   Staphylococcus lugdunensis NOT DETECTED NOT DETECTED   Streptococcus species NOT DETECTED NOT DETECTED   Streptococcus agalactiae NOT DETECTED NOT DETECTED   Streptococcus pneumoniae NOT DETECTED NOT DETECTED   Streptococcus pyogenes NOT DETECTED NOT DETECTED   A.calcoaceticus-baumannii NOT DETECTED NOT DETECTED   Bacteroides fragilis NOT DETECTED NOT DETECTED   Enterobacterales NOT DETECTED NOT DETECTED   Enterobacter cloacae complex NOT DETECTED NOT DETECTED   Escherichia coli NOT DETECTED NOT DETECTED   Klebsiella aerogenes NOT DETECTED NOT DETECTED   Klebsiella oxytoca NOT DETECTED NOT DETECTED   Klebsiella pneumoniae NOT DETECTED NOT DETECTED   Proteus species NOT DETECTED NOT DETECTED   Salmonella species NOT DETECTED NOT DETECTED   Serratia marcescens NOT DETECTED NOT DETECTED   Haemophilus influenzae NOT DETECTED NOT DETECTED   Neisseria meningitidis NOT DETECTED NOT DETECTED   Pseudomonas  aeruginosa NOT DETECTED NOT DETECTED   Stenotrophomonas maltophilia NOT DETECTED NOT DETECTED   Candida albicans NOT DETECTED NOT DETECTED   Candida auris NOT DETECTED NOT DETECTED   Candida glabrata NOT DETECTED NOT DETECTED   Candida krusei NOT DETECTED NOT DETECTED   Candida parapsilosis NOT DETECTED NOT DETECTED   Candida tropicalis NOT DETECTED NOT DETECTED   Cryptococcus neoformans/gattii NOT DETECTED NOT DETECTED   Methicillin resistance mecA/C DETECTED (A) NOT DETECTED   Vancomycin resistance NOT DETECTED NOT DETECTED    Amariz Flamenco D 06/16/2021  10:04 PM

## 2021-07-12 NOTE — Evaluation (Signed)
Physical Therapy Evaluation Patient Details Name: Steve Lloyd MRN: [id number] DOB: Sep 27, 1930 Today's Date: 06/24/2021  History of Present Illness  87 y.o. male with a history as listed below which includes some mild dementia and who presents with acute worsening mental status.  His family reported to paramedics that he had a fall about 4 days ago and was evaluated and medically cleared.  However since that time he has gradually worsened and his altered mental status has become severe tonight.  Family reports that since then he has had multiple falls as and been ambulating around the house urinating and defecating "all over."  Clinical Impression  Pt pleasantly confused t/o the session.  He was able to state his name and birthday but did not know date, situation, etc.  He needed extra cuing and encouragement t/o the session to insure that he was staying on task as well as simply to initiate even basic movement/transitions.  He had multiple small stagger steps during ambulation (both with and w/o AD) with no overt LOBs.  He reported some minimal fatigue with ~75 ft of ambulation but generally speaking did reasonably well with close supervision and plenty of cuing.  Pt, however, is not at his baseline and shows very poor safety and general awareness, recommending STR once medically ready for d/c to help address safety with mobility/ambulation as well as work on strength, balance and gait.     Recommendations for follow up therapy are one component of a multi-disciplinary discharge planning process, led by the attending physician.  Recommendations may be updated based on patient status, additional functional criteria and insurance authorization.  Follow Up Recommendations SNF;Supervision/Assistance - 24 hour    Equipment Recommendations   (apparently he has a walker but does not use it)    Recommendations for Other Services       Precautions / Restrictions Precautions Precautions:  Fall Restrictions Weight Bearing Restrictions: No      Mobility  Bed Mobility Overal bed mobility: Needs Assistance Bed Mobility: Supine to Sit;Sit to Supine     Supine to sit: Min assist Sit to supine: Min assist   General bed mobility comments: Pt was able to move in bed relatively well once he finally initiated movement (after a lot of cuing/encouragement) - he did need direct assist to initiate movement to get LEs back into bed    Transfers Overall transfer level: Needs assistance Equipment used: Rolling walker (2 wheeled) Transfers: Sit to/from Stand Sit to Stand: Min assist         General transfer comment: Pt initially impulsively trying to get up without having LEs well positioned under him and leaning/falling back onto bed.  Cues (and phyiscal assist) to set up feet for better success, CGA only to guide hips forward on the successful sit to stand attempt.  Ambulation/Gait Ambulation/Gait assistance: Min assist Gait Distance (Feet): 75 Feet Assistive device: Rolling walker (2 wheeled);None       General Gait Details: Pt with slow but reasonably confident ambulation.  apparently he typically is not willing to use a walker but did well with it for the first 40 ft with minimal cuing to insure appropriate use/positioning.  He did, however, have a few small stagger steps w/o LOBs.  Trial of ambulation w/o AD during return to room with pt using hand on wall/objects nearly the entire time despite cuing to try w/o UE support.  Pt with slower and less steady gait w/o AD.  Stairs  Wheelchair Mobility    Modified Rankin (Stroke Patients Only)       Balance Overall balance assessment: Needs assistance Sitting-balance support: Single extremity supported Sitting balance-Leahy Scale: Good       Standing balance-Leahy Scale: Fair Standing balance comment: Pt needing some direct assist with transition to standing (leaning back of LEs on bed/leaning back in  general) and had numerous small scale stagger steps during ambulation (both w/ and w/o AD/UEs)                             Pertinent Vitals/Pain Pain Assessment: No/denies pain    Home Living Family/patient expects to be discharged to:: Unsure                 Additional Comments: Pt lives with 77 y/o wife, apparently has been needing more and more assistance with increased confusion recently (both in frequency and severity)    Prior Function Level of Independence: Needs assistance   Gait / Transfers Assistance Needed: apparently he has not traditionally needed a lot of phyiscal assist, cruises on the funiture and only rarely will use walker.  Increased unsteadiness reported by family recently.           Hand Dominance        Extremity/Trunk Assessment   Upper Extremity Assessment Upper Extremity Assessment: Overall WFL for tasks assessed;Difficult to assess due to impaired cognition;Generalized weakness    Lower Extremity Assessment Lower Extremity Assessment: Generalized weakness;Difficult to assess due to impaired cognition;Overall WFL for tasks assessed       Communication   Communication: No difficulties  Cognition Arousal/Alertness: Awake/alert Behavior During Therapy: Impulsive Overall Cognitive Status: Difficult to assess                                 General Comments: h/o dementia, per phone call with daughter he has better and worse days but can typically state the date and have some awareness, today he has no temporal or situational awareness and reports needing to get back to work.  Pt here for confusion which has persisted      General Comments General comments (skin integrity, edema, etc.): Pt very confused today, does not know date, that he is in the hosptial or reliably give any information about home, PLOF, etc    Exercises     Assessment/Plan    PT Assessment Patient needs continued PT services  PT Problem List  Decreased strength;Decreased range of motion;Decreased activity tolerance;Decreased mobility;Decreased balance;Decreased knowledge of use of DME;Decreased safety awareness;Decreased cognition;Decreased knowledge of precautions;Decreased coordination       PT Treatment Interventions DME instruction;Gait training;Functional mobility training;Therapeutic activities;Therapeutic exercise;Balance training;Neuromuscular re-education;Cognitive remediation;Patient/family education    PT Goals (Current goals can be found in the Care Plan section)  Acute Rehab PT Goals Patient Stated Goal: daughter hoping pt can go to rehab to get safer and more steady PT Goal Formulation: With family Time For Goal Achievement: 07/26/21 Potential to Achieve Goals: Fair    Frequency Min 2X/week   Barriers to discharge Decreased caregiver support      Co-evaluation               AM-PAC PT "6 Clicks" Mobility  Outcome Measure Help needed turning from your back to your side while in a flat bed without using bedrails?: A Little Help needed moving from lying on your back to  sitting on the side of a flat bed without using bedrails?: A Little Help needed moving to and from a bed to a chair (including a wheelchair)?: A Little Help needed standing up from a chair using your arms (e.g., wheelchair or bedside chair)?: A Little Help needed to walk in hospital room?: A Lot Help needed climbing 3-5 steps with a railing? : A Lot 6 Click Score: 16    End of Session Equipment Utilized During Treatment: Gait belt Activity Tolerance: Patient tolerated treatment well;Other (comment) (needing almost constant directional/situational cuing) Patient left: in bed;with call bell/phone within reach;with family/visitor present;with nursing/sitter in room Nurse Communication: Mobility status PT Visit Diagnosis: Muscle weakness (generalized) (M62.81);Difficulty in walking, not elsewhere classified (R26.2);Unsteadiness on feet  (R26.81)    Time: 2951-8841 PT Time Calculation (min) (ACUTE ONLY): 27 min   Charges:   PT Evaluation $PT Eval Low Complexity: 1 Low PT Treatments $Gait Training: 8-22 mins        Kreg Shropshire, DPT 07/06/2021, 4:29 PM

## 2021-07-12 NOTE — ED Provider Notes (Signed)
[facility] Emergency Department Provider Note  ____________________________________________   Event Date/Time   First MD Initiated Contact with Patient 06/22/2021 [phone]     (approximate)  I have reviewed the triage vital signs and the nursing notes.   HISTORY  Chief Complaint Fall  Level 5 caveat:  history/ROS limited by acute/critical illness  HPI Steve Lloyd is a 87 y.o. male with a history as listed below which includes some mild dementia and who presents with acute worsening mental status.  His family reported to paramedics that he had a fall about 4 days ago and was evaluated and medically cleared.  However since that time he has gradually worsened and his altered mental status has become severe tonight.  By this morning he has had multiple falls as he has been ambulating around the house and urinating and defecating "all over the place".  He is able to answer simple questions such as his name and date of birth, but he is not able to provide any history.  He says "no" when asked if he has any pain and also denies having any shortness of breath.     Past Medical History:  Diagnosis Date   Atrial fibrillation with slow ventricular response ([place])    hx/notes 06/20/2016   Coronary artery disease    Hyperlipidemia    Kidney stones    Pneumonia ~ 1950   Presence of permanent cardiac pacemaker    S/P cardiac pacemaker procedure, [employer] 06/20/16  06/21/2016   Type II diabetes mellitus ([place])     Patient Active Problem List   Diagnosis Date Noted   Pacemaker 11/06/2018   S/P cardiac pacemaker procedure, [employer] 06/20/16  06/21/2016   Symptomatic bradycardia 06/14/2016   CAD s/p CABG LIMA to LAD 1998 10/24/2013   Hypercholesterolemia 10/24/2013   Essential hypertension 10/24/2013   Diabetes mellitus type 2 with complications, controlled 10/24/2013   Atrial fibrillation with slow ventricular response ([place]) 01/01/2013   Long term  current use of anticoagulant therapy 01/01/2013    Past Surgical History:  Procedure Laterality Date   APPENDECTOMY     CATARACT EXTRACTION W/ INTRAOCULAR LENS  IMPLANT, BILATERAL Bilateral    CORONARY ARTERY BYPASS GRAFT  1998   single-vessel LIMA to LAD/notes 06/20/2016   EP IMPLANTABLE DEVICE N/A 06/20/2016   Procedure: Pacemaker Implant;  Surgeon: Sanda Klein, MD;  Location: [facility];  Service: Cardiovascular;  Laterality: N/A;   INGUINAL HERNIA REPAIR Right    INSERT / REPLACE / REMOVE PACEMAKER  06/20/2016    Prior to Admission medications   Medication Sig Start Date End Date Taking? Authorizing Provider  amLODipine (NORVASC) 2.5 MG tablet Take 1 tablet (2.5 mg total) by mouth daily. 04/25/21   Croitoru, Mihai, MD  atorvastatin (LIPITOR) 10 MG tablet Take 10 mg by mouth daily.    [provider]  benazepril (LOTENSIN) 10 MG tablet Take 10 mg by mouth daily.    [provider]  Cyanocobalamin (VITAMIN B-12 IJ) Inject 1 mg as directed every 30 (thirty) days.     [provider]  donepezil (ARICEPT) 5 MG tablet Take by mouth. 08/19/18   [provider]  ELIQUIS 5 MG TABS tablet TAKE 1 TABLET BY MOUTH TWICE A DAY 05/11/21   Croitoru, Mihai, MD  Multiple Vitamin (MULTIVITAMIN) tablet Take 1 tablet by mouth daily.    [provider]  rivastigmine (EXELON) 4.6 mg/24hr 4.6 mg daily. 08/01/19   [provider]  sertraline (ZOLOFT) 50 MG tablet Take 50 mg by mouth daily. 07/17/19   [provider]    Allergies Patient has no known allergies.  No family history on file.  Social History Social History   Tobacco Use   Smoking status: Former    Packs/day: 1.00    Years: 8.00    Pack years: 8.00    Types: Cigarettes    Quit date: 10/15/1956    Years since quitting: 64.7   Smokeless tobacco: Never  Substance Use Topics   Alcohol use: Yes    Alcohol/week: 14.0 standard drinks    Types: 14 Glasses of wine per week     Comment: 06/20/2016 "2 glasses of wine q night"   Drug use: No    Review of Systems Level 5 caveat:  history/ROS limited by acute/critical illness  ____________________________________________   PHYSICAL EXAM:  ED Triage Vitals  Enc Vitals Group     BP 07/09/2021 0612 (!) 153/101     Pulse Rate 06/25/2021 0612 68     Resp 07/14/2021 0612 20     Temp 07/11/2021 0612 (!) 97.2 F (36.2 C)     Temp Source 06/18/2021 0612 Rectal     SpO2 07/07/2021 0607 90 %     Weight 06/28/2021 0609 67 kg (147 lb 11.3 oz)     Height 06/18/2021 0609 1.702 m (5\' 7" )     Head Circumference --      Peak Flow --      Pain Score --      Pain Loc --      Pain Edu? --      Excl. in [place]? --      Constitutional: Awake and alert, oriented to self and birthdate but otherwise disoriented. Eyes: Conjunctivae are normal.  Head: Atraumatic.  I do not see any visible signs of trauma at this time. Nose: No congestion/rhinnorhea. Mouth/Throat: Patient is wearing a mask. Neck: No stridor.  No meningeal signs.   Cardiovascular: Normal rate, irregular rhythm. Good peripheral circulation. Respiratory: Normal respiratory effort.  No retractions. Gastrointestinal: Soft and nontender. No distention.  Musculoskeletal: No lower extremity tenderness nor edema. No gross deformities of extremities.  Normal range of motion in his extremities with no obvious pain or tenderness. Neurologic:  Normal speech and language. No gross focal neurologic deficits are appreciated.  GCS 14 for confusion. Skin:  Skin is warm, dry and intact.   ____________________________________________   LABS (all labs ordered are listed, but only abnormal results are displayed)  Labs Reviewed  RESP PANEL BY RT-PCR (FLU A&B, COVID) ARPGX2  URINE CULTURE  CULTURE, BLOOD (SINGLE)  LACTIC ACID, PLASMA  LACTIC ACID, PLASMA  COMPREHENSIVE METABOLIC PANEL  CBC WITH DIFFERENTIAL/PLATELET  PROTIME-INR  APTT  URINALYSIS, COMPLETE (UACMP) WITH MICROSCOPIC   LIPASE, BLOOD  TROPONIN I (HIGH SENSITIVITY)   ____________________________________________  EKG  ED ECG REPORT I, Hinda Kehr, the attending physician, personally viewed and interpreted this ECG.  Date: 07/13/2021 EKG Time: 6:10 AM Rate: 70 Rhythm: Atrial fibrillation QRS Axis: normal Intervals: Left bundle branch block ST/T Wave abnormalities: Non-specific ST segment / T-wave changes, but no clear evidence of acute ischemia. Narrative Interpretation: no definitive evidence of acute ischemia; does not meet STEMI criteria.  ____________________________________________  RADIOLOGY  Head CT, cervical spine CT, and chest x-ray all pending at the time of transfer of care. ____________________________________________   PROCEDURES   Procedure(s) performed (including Critical Care):  .1-3 Lead EKG Interpretation Performed by: Hinda Kehr, MD Authorized by:  Hinda Kehr, MD     Interpretation: abnormal     ECG rate:  73   ECG rate assessment: normal     Rhythm: atrial fibrillation     Ectopy: none     Conduction: normal     ____________________________________________   INITIAL IMPRESSION / MDM / ASSESSMENT AND PLAN / ED COURSE  As part of my medical decision making, I reviewed the following data within the [facility] notes reviewed and incorporated, Labs reviewed , EKG interpreted , Old chart reviewed, Patient signed out to Dr. Cherylann Banas, and reviewed Notes from prior ED visits   Differential diagnosis includes, but is not limited to, sepsis, dementia, intracranial bleed including subarachnoid hemorrhage or subdural hematoma, electrolyte or metabolic abnormality.  The patient is on the cardiac monitor to evaluate for evidence of arrhythmia and/or significant heart rate changes.         ____________________________________________  FINAL CLINICAL IMPRESSION(S) / ED DIAGNOSES  Final diagnoses:  Multiple falls  Delirium      MEDICATIONS GIVEN DURING THIS VISIT:  Medications  sodium chloride 0.9 % bolus 500 mL (has no administration in time range)     ED Discharge Orders     None        Note:  This document was prepared using Dragon voice recognition software and may include unintentional dictation errors.   Hinda Kehr, MD 06/17/2021 [phone]

## 2021-07-12 NOTE — ED Provider Notes (Signed)
-----------------------------------------   8:13 AM on 06/25/2021 -----------------------------------------  I took over care of this patient from Dr. Karma Greaser.  CT head and cervical spine are negative for acute findings.  Chest x-ray shows bilateral opacities concerning for pneumonia, and I have initiated antibiotics for CAP.Marland Kitchen  Urinalysis is still pending.  Lactate is normal and the other lab work-up is overall reassuring.  I discussed the results so far with the stepdaughter/healthcare proxy.  I consulted Dr. Blaine Hamper from the hospitalist service for admission.   Arta Silence, MD 06/28/2021 [phone]

## 2021-07-12 NOTE — ED Notes (Signed)
Patient transported to CT 

## 2021-07-12 NOTE — Progress Notes (Signed)
Pharmacy Antibiotic Note  Steve Lloyd is a 87 y.o. male admitted on 06/21/2021 with pneumonia.  Pharmacy has been consulted for Vancomycin dosing.  Staph Epi (Mec A +) and E Faecalis on BCID 9/27  Plan: Vancomycin 1500 mg IV X 1 given in ED on 9/27 @ 2200. Vancomycin 1750 mg IV Q48H ordered to start on 9/29 @ 2200.   AUC = 490.2  Vanc trough = 10.7   Height: 5\' 7"  (170.2 cm) Weight: 67 kg (147 lb 11.3 oz) IBW/kg (Calculated) : 66.1  Temp (24hrs), Avg:97.2 F (36.2 C), Min:97.2 F (36.2 C), Max:97.2 F (36.2 C)  Recent Labs  Lab 07/13/2021 0612 06/28/2021 0616 06/30/2021 0837  WBC  --  8.5  --   CREATININE  --  1.26*  --   LATICACIDVEN 1.8  --  0.9    Estimated Creatinine Clearance: 37.2 mL/min (A) (by C-G formula based on SCr of 1.26 mg/dL (H)).    No Known Allergies  Antimicrobials this admission:   >>    >>   Dose adjustments this admission:   Microbiology results:  BCx:  Staph Epi (Mec A +) and E Faecalis on BCID 9/27  UCx:    Sputum:    MRSA PCR:   Thank you for allowing pharmacy to be a part of this patient's care.  Darian Cansler D 07/04/2021 10:59 PM

## 2021-07-12 NOTE — ED Notes (Signed)
Pt resting comfortably. Call light in reach. Bed alarm active and audible. Cardiac monitoring continued. Wife at bedside.

## 2021-07-12 NOTE — ED Notes (Signed)
Lab called with troponin of 101.  MD made aware.

## 2021-07-12 NOTE — ED Triage Notes (Signed)
By EMS from Home for multiple falls; Hx of Dementia

## 2021-07-12 NOTE — ED Notes (Signed)
Pt pulse rate 64 and O2 99% verified by this RN at this time. Pt had been messing with pulseox when other RN validated vitals, readings validated inaccurate.

## 2021-07-12 NOTE — Progress Notes (Signed)
OT Cancellation Note  Patient Details Name: Steve Lloyd MRN: [id number] DOB: December 08, 1929   Cancelled Treatment:    Reason Eval/Treat Not Completed: Other (comment). Consult received, chart reviewed. Pt working with PT upon attempt. Will re-attempt OT evaluation at later date/time as pt is available and appropriate.   Ardeth Perfect., MPH, MS, OTR/L ascom [phone] 06/26/2021, 4:27 PM

## 2021-07-13 ENCOUNTER — Inpatient Hospital Stay: Payer: PPO

## 2021-07-13 ENCOUNTER — Encounter: Payer: Self-pay | Admitting: Internal Medicine

## 2021-07-13 DIAGNOSIS — R4182 Altered mental status, unspecified: Secondary | ICD-10-CM | POA: Diagnosis not present

## 2021-07-13 DIAGNOSIS — R296 Repeated falls: Secondary | ICD-10-CM

## 2021-07-13 DIAGNOSIS — I429 Cardiomyopathy, unspecified: Secondary | ICD-10-CM | POA: Diagnosis not present

## 2021-07-13 DIAGNOSIS — R7881 Bacteremia: Secondary | ICD-10-CM

## 2021-07-13 DIAGNOSIS — B952 Enterococcus as the cause of diseases classified elsewhere: Secondary | ICD-10-CM

## 2021-07-13 DIAGNOSIS — I482 Chronic atrial fibrillation, unspecified: Secondary | ICD-10-CM | POA: Diagnosis not present

## 2021-07-13 DIAGNOSIS — G9341 Metabolic encephalopathy: Secondary | ICD-10-CM

## 2021-07-13 DIAGNOSIS — J189 Pneumonia, unspecified organism: Secondary | ICD-10-CM | POA: Diagnosis not present

## 2021-07-13 DIAGNOSIS — R410 Disorientation, unspecified: Secondary | ICD-10-CM

## 2021-07-13 DIAGNOSIS — R778 Other specified abnormalities of plasma proteins: Secondary | ICD-10-CM | POA: Diagnosis not present

## 2021-07-13 LAB — URINE CULTURE: Culture: NO GROWTH

## 2021-07-13 LAB — CBC
HCT: 28.6 % — ABNORMAL LOW (ref 39.0–52.0)
Hemoglobin: 9.3 g/dL — ABNORMAL LOW (ref 13.0–17.0)
MCH: 31.1 pg (ref 26.0–34.0)
MCHC: 32.5 g/dL (ref 30.0–36.0)
MCV: 95.7 fL (ref 80.0–100.0)
Platelets: 198 10*3/uL (ref 150–400)
RBC: 2.99 MIL/uL — ABNORMAL LOW (ref 4.22–5.81)
RDW: 15.6 % — ABNORMAL HIGH (ref 11.5–15.5)
WBC: 12.1 10*3/uL — ABNORMAL HIGH (ref 4.0–10.5)
nRBC: 0 % (ref 0.0–0.2)

## 2021-07-13 LAB — BASIC METABOLIC PANEL WITH GFR
Anion gap: 12 (ref 5–15)
BUN: 30 mg/dL — ABNORMAL HIGH (ref 8–23)
CO2: 22 mmol/L (ref 22–32)
Calcium: 8.7 mg/dL — ABNORMAL LOW (ref 8.9–10.3)
Chloride: 103 mmol/L (ref 98–111)
Creatinine, Ser: 1.35 mg/dL — ABNORMAL HIGH (ref 0.61–1.24)
GFR, Estimated: 50 mL/min — ABNORMAL LOW
Glucose, Bld: 155 mg/dL — ABNORMAL HIGH (ref 70–99)
Potassium: 3.7 mmol/L (ref 3.5–5.1)
Sodium: 137 mmol/L (ref 135–145)

## 2021-07-13 LAB — TROPONIN I (HIGH SENSITIVITY)
Troponin I (High Sensitivity): 673 ng/L
Troponin I (High Sensitivity): 759 ng/L

## 2021-07-13 LAB — LIPID PANEL
Cholesterol: 91 mg/dL (ref 0–200)
HDL: 36 mg/dL — ABNORMAL LOW (ref >40)
LDL Cholesterol: 40 mg/dL (ref 0–99)
Total CHOL/HDL Ratio: 2.5 RATIO
Triglycerides: 74 mg/dL (ref <150)
VLDL: 15 mg/dL (ref 0–40)

## 2021-07-13 LAB — ECHOCARDIOGRAM COMPLETE
Height: 67 in
S' Lateral: 4.2 cm
Weight: 2363.33 oz

## 2021-07-13 LAB — CBG MONITORING, ED: Glucose-Capillary: 135 mg/dL — ABNORMAL HIGH (ref 70–99)

## 2021-07-13 LAB — FERRITIN: Ferritin: 70 ng/mL (ref 24–336)

## 2021-07-13 LAB — RETICULOCYTES
Immature Retic Fract: 33.2 % — ABNORMAL HIGH (ref 2.3–15.9)
RBC.: 3.01 MIL/uL — ABNORMAL LOW (ref 4.22–5.81)
Retic Count, Absolute: 94.2 10*3/uL (ref 19.0–186.0)
Retic Ct Pct: 3.1 % (ref 0.4–3.1)

## 2021-07-13 LAB — FOLATE: Folate: 19.7 ng/mL (ref >5.9)

## 2021-07-13 LAB — STREP PNEUMONIAE URINARY ANTIGEN: Strep Pneumo Urinary Antigen: NEGATIVE

## 2021-07-13 LAB — IRON AND TIBC
Iron: 17 ug/dL — ABNORMAL LOW (ref 45–182)
Saturation Ratios: 6 % — ABNORMAL LOW (ref 17.9–39.5)
TIBC: 270 ug/dL (ref 250–450)
UIBC: 253 ug/dL

## 2021-07-13 LAB — VITAMIN B12: Vitamin B-12: 528 pg/mL (ref 180–914)

## 2021-07-13 MED ORDER — IPRATROPIUM-ALBUTEROL 0.5-2.5 (3) MG/3ML IN SOLN
3.0000 mL | Freq: Four times a day (QID) | RESPIRATORY_TRACT | Status: DC
  Administered 2021-07-13 – 2021-07-14 (×5): 3 mL via RESPIRATORY_TRACT
  Filled 2021-07-13 (×6): qty 3

## 2021-07-13 MED ORDER — FUROSEMIDE 10 MG/ML IJ SOLN
40.0000 mg | Freq: Once | INTRAMUSCULAR | Status: AC
  Administered 2021-07-13: 40 mg via INTRAVENOUS
  Filled 2021-07-13: qty 4

## 2021-07-13 MED ORDER — HALOPERIDOL LACTATE 5 MG/ML IJ SOLN
2.0000 mg | Freq: Four times a day (QID) | INTRAMUSCULAR | Status: DC | PRN
  Administered 2021-07-13 – 2021-07-14 (×2): 2 mg via INTRAMUSCULAR
  Filled 2021-07-13 (×3): qty 1

## 2021-07-13 MED ORDER — HALOPERIDOL LACTATE 5 MG/ML IJ SOLN
INTRAMUSCULAR | Status: AC
  Administered 2021-07-13: 2 mg via INTRAMUSCULAR
  Filled 2021-07-13: qty 1

## 2021-07-13 MED ORDER — LORAZEPAM 2 MG/ML IJ SOLN: 0.5000 mg | Freq: Once | INTRAMUSCULAR | Status: DC

## 2021-07-13 MED ORDER — BENAZEPRIL HCL 5 MG PO TABS
5.0000 mg | ORAL_TABLET | Freq: Every day | ORAL | Status: DC
  Administered 2021-07-13 – 2021-07-15 (×3): 5 mg via ORAL
  Filled 2021-07-13 (×4): qty 1

## 2021-07-13 MED ORDER — FUROSEMIDE 10 MG/ML IJ SOLN
20.0000 mg | Freq: Every day | INTRAMUSCULAR | Status: DC
  Administered 2021-07-14 – 2021-07-15 (×2): 20 mg via INTRAVENOUS
  Filled 2021-07-13 (×2): qty 2

## 2021-07-13 MED ORDER — MORPHINE SULFATE (PF) 2 MG/ML IV SOLN
1.0000 mg | INTRAVENOUS | Status: DC | PRN
  Administered 2021-07-13: 1 mg via INTRAVENOUS
  Filled 2021-07-13: qty 1

## 2021-07-13 MED ORDER — METOPROLOL SUCCINATE ER 25 MG PO TB24
12.5000 mg | ORAL_TABLET | Freq: Every day | ORAL | Status: DC
  Administered 2021-07-13 – 2021-07-15 (×3): 12.5 mg via ORAL
  Filled 2021-07-13: qty 0.5
  Filled 2021-07-13 (×2): qty 1

## 2021-07-13 NOTE — ED Notes (Addendum)
Pt now asleep. Per MD, hold off on CXR & ABG until day shift.

## 2021-07-13 NOTE — ED Notes (Signed)
Informed RN bed assigned 

## 2021-07-13 NOTE — ED Notes (Signed)
Pt was offered meal tray but pt refused to eat anything on it. I tried to assist pt but pt stated, "I do not need any help." Pt drunk a couple sips of water and wanted the tray moved. Bedside table was moved with tray staying on top in case pt changed his mind and wanted to eat it later.

## 2021-07-13 NOTE — ED Notes (Addendum)
Pt was wet. This tech and RN, Leonette Most, changed pt and pt's bedding. We applied a new external catheter, brief, and gown. Pt is calm at this time.

## 2021-07-13 NOTE — ED Notes (Signed)
ERT remains at bedside as sitter for safety. Pt placed in mitts as he continues to attempt to remove all equipment.

## 2021-07-13 NOTE — ED Notes (Signed)
Lab in room.

## 2021-07-13 NOTE — ED Notes (Signed)
Report given to [facility] RN - all questions answered.

## 2021-07-13 NOTE — Progress Notes (Addendum)
Progress Note    Steve Lloyd  EVO:[id number] DOB: 1930/04/21  DOA: 06/24/2021 PCP: Everardo Pacific, MD      Brief Narrative:    Medical records reviewed and are as summarized below:  Steve Lloyd is a 87 y.o. male with medical history significant of hypertension, hyperlipidemia, diabetes mellitus, pacemaker placement, CAD, CABG, atrial fibrillation on Eliquis, kidney stone, dementia, CKD stage IIIa, who presented to the hospital with fall, cough and change in mental status/confusion.  He did not have any chest pain or shortness of breath but his troponins were elevated (maximum troponin level was 764).  He was admitted to the hospital for bilateral pneumonia.  He was treated with empiric IV antibiotics.  Blood culture revealed Enterococcus faecalis and Staph epidermidis.  ID was consulted to assist with management.  He was seen in consultation by the cardiologist for elevated troponins.  2D echo revealed EF estimated at 25 to 30%, severely dilated right and left atria, moderate to severe tricuspid regurgitation and LV diastolic parameters were indeterminate.  Per cardiologist, he is not a good candidate ischemia evaluation/invasive work-up because of severe dementia.    Assessment/Plan:   Principal Problem:   CAP (community acquired pneumonia) Active Problems:   CAD s/p CABG [place] to LAD 1998   Hypercholesterolemia   Essential hypertension   Type II diabetes mellitus with renal manifestations (HCC)   Atrial fibrillation, chronic (HCC)   Dementia (HCC)   CKD (chronic kidney disease), stage IIIa   Hypokalemia   Elevated troponin   Fall at home, initial encounter   Acute metabolic encephalopathy   Normocytic anemia   Body mass index is 23.13 kg/m.   Bilateral pneumonia, Enterococcus faecalis and Staph epidermidis bacteremia: Continue IV vancomycin.  No evidence of vegetation on 2D echo.  Follow-up with ID for further recommendations.  Acute hypoxemic  respiratory failure: He is requiring 4 L/min oxygen via nasal cannula.  Taper off oxygen as able.  Elevated troponins, CAD s/p CABG: Maximum troponin was 764.  He is not a candidate for invasive cardiac work-up because of severe dementia.    Cardiomyopathy/chronic systolic heart CHF: 2D echo showed EF estimated at 25 to 30%.  He has been started on low-dose  Toprol-XL and low-dose Lasix.  Permanent atrial fibrillation, S/p permanent pacemaker: Eliquis on hold  Hyperkalemia: Improved  Acute metabolic encephalopathy with underlying severe dementia with behavioral disturbance: Continue supportive care  S/p fall at home, debility: PT recommends discharge to SNF.  Follow-up with social worker to assist with disposition.  Other comorbidities include type II DM, hypertension, CKD stage IIIa, chronic anemia/iron deficiency anemia   Diet Order             Diet Heart Room service appropriate? Yes; Fluid consistency: Thin  Diet effective now                      Consultants: Cardiologist Infectious disease  Procedures: None    Medications:    atorvastatin  10 mg Oral Daily   benazepril  5 mg Oral Daily   [START ON 07/14/2021] furosemide  20 mg Intravenous Daily   ipratropium-albuterol  3 mL Nebulization QID   LORazepam  0.5 mg Intravenous Once   metoprolol succinate  12.5 mg Oral Daily   rivastigmine  4.6 mg Transdermal Daily   sertraline  50 mg Oral Daily   Continuous Infusions:  azithromycin Stopped (07/13/21 1104)   [START ON 07/14/2021] vancomycin  Anti-infectives (From admission, onward)    Start     Dose/Rate Route Frequency Ordered Stop   07/14/21 2200  vancomycin (VANCOREADY) IVPB 1750 mg/350 mL        1,750 mg 175 mL/hr over 120 Minutes Intravenous Every 48 hours 06/29/2021 2259     07/04/2021 2200  vancomycin (VANCOREADY) IVPB 1500 mg/300 mL        1,500 mg 150 mL/hr over 120 Minutes Intravenous  Once 07/02/2021 2153 07/13/21 0024   07/03/2021 0745   cefTRIAXone (ROCEPHIN) 2 g in sodium chloride 0.9 % 100 mL IVPB  Status:  Discontinued        2 g 200 mL/hr over 30 Minutes Intravenous Every 24 hours 06/26/2021 0743 06/16/2021 2154   07/11/2021 0745  azithromycin (ZITHROMAX) 500 mg in sodium chloride 0.9 % 250 mL IVPB        500 mg 250 mL/hr over 60 Minutes Intravenous Every 24 hours 07/14/2021 0743 07/17/21 0744              Family Communication/Anticipated D/C date and plan/Code Status   DVT prophylaxis: Place and maintain sequential compression device Start: 07/10/2021 1117     Code Status: DNR  Family Communication: Judson Roch , daughter Disposition Plan:    Status is: Inpatient  Remains inpatient appropriate because:IV treatments appropriate due to intensity of illness or inability to take PO and Inpatient level of care appropriate due to severity of illness  Dispo: The patient is from: Home              Anticipated d/c is to: SNF              Patient currently is not medically stable to d/c.   Difficult to place patient No           Subjective:   Interval events noted.  He is confused and unable to provide any history.  His nurse and nurse assistant were at the bedside.  Objective:    Vitals:   07/13/21 0635 07/13/21 0930 07/13/21 1200 07/13/21 1250  BP: 124/66 126/66 120/65 124/65  Pulse: 70 68 67 76  Resp: (!) 23 (!) 25 (!) 28 (!) 21  Temp:      TempSrc:      SpO2: 99% 98% 100% 99%  Weight:      Height:       No data found.   Intake/Output Summary (Last 24 hours) at 07/13/2021 1409 Last data filed at 07/13/2021 1104 Gross per 24 hour  Intake 550 ml  Output --  Net 550 ml   Filed Weights   06/26/2021 0609  Weight: 67 kg    Exam:  GEN: NAD SKIN: Erythematous macular rash on the back EYES: No pallor or icterus ENT: MMM CV: RRR PULM: Bibasilar rales, no wheezing ABD: soft, ND, NT, +BS CNS: Alert but disoriented, non focal EXT: No edema or tenderness        Data Reviewed:   I have  personally reviewed following labs and imaging studies:  Labs: Labs show the following:   Basic Metabolic Panel: Recent Labs  Lab 06/21/2021 0616 06/28/2021 0832 07/13/21 0616  NA 135  --  137  K 3.4*  --  3.7  CL 101  --  103  CO2 21*  --  22  GLUCOSE 139*  --  155*  BUN 31*  --  30*  CREATININE 1.26*  --  1.35*  CALCIUM 8.5*  --  8.7*  MG  --  2.0  --  GFR Estimated Creatinine Clearance: 34.7 mL/min (A) (by C-G formula based on SCr of 1.35 mg/dL (H)). Liver Function Tests: Recent Labs  Lab 07/07/2021 0616  AST 30  ALT 22  ALKPHOS 57  BILITOT 1.2  PROT 6.1*  ALBUMIN 2.9*   Recent Labs  Lab 06/22/2021 0616  LIPASE 38   No results for input(s): AMMONIA in the last 168 hours. Coagulation profile Recent Labs  Lab 07/10/2021 0616  INR 1.7*    CBC: Recent Labs  Lab 07/10/2021 0616 07/04/2021 0631 07/13/21 0616  WBC 8.5  --  12.1*  NEUTROABS 6.5  --   --   HGB 9.1* 8.8* 9.3*  HCT 26.8* 26.4* 28.6*  MCV 94.7  --  95.7  PLT 176  --  198   Cardiac Enzymes: No results for input(s): CKTOTAL, CKMB, CKMBINDEX, TROPONINI in the last 168 hours. BNP (last 3 results) No results for input(s): PROBNP in the last 8760 hours. CBG: Recent Labs  Lab 07/13/21 0822  GLUCAP 135*   D-Dimer: No results for input(s): DDIMER in the last 72 hours. Hgb A1c: Recent Labs    06/16/2021 0832  HGBA1C 6.1*   Lipid Profile: Recent Labs    07/13/21 0616  CHOL 91  HDL 36*  LDLCALC 40  TRIG 74  CHOLHDL 2.5   Thyroid function studies: No results for input(s): TSH, T4TOTAL, T3FREE, THYROIDAB in the last 72 hours.  Invalid input(s): FREET3 Anemia work up: Recent Labs    07/13/21 0616  VITAMINB12 528  FOLATE 19.7  FERRITIN 70  TIBC 270  IRON 17*  RETICCTPCT 3.1   Sepsis Labs: Recent Labs  Lab 06/23/2021 0612 07/11/2021 0616 06/23/2021 0837 07/13/21 0616  PROCALCITON  --  0.11  --   --   WBC  --  8.5  --  12.1*  LATICACIDVEN 1.8  --  0.9  --     Microbiology Recent  Results (from the past 240 hour(s))  Blood culture (routine single)     Status: None (Preliminary result)   Collection Time: 06/30/2021  6:12 AM   Specimen: BLOOD  Result Value Ref Range Status   Specimen Description BLOOD  LAC  Final   Special Requests   Final    BOTTLES DRAWN AEROBIC AND ANAEROBIC Blood Culture adequate volume   Culture  Setup Time   Final    GRAM POSITIVE COCCI Organism ID to follow CRITICAL RESULT CALLED TO, READ BACK BY AND VERIFIED WITH: JASON ROBBINS PHARMD 2139 06/21/2021 HNM IN BOTH AEROBIC AND ANAEROBIC BOTTLES Performed at [facility], [address], [place], [place] [address]    Culture [facility] POSITIVE COCCI  Final   Report Status PENDING  Incomplete  Blood Culture ID Panel (Reflexed)     Status: Abnormal   Collection Time: 06/22/2021  6:12 AM  Result Value Ref Range Status   Enterococcus faecalis DETECTED (A) NOT DETECTED Final    Comment: CRITICAL RESULT CALLED TO, READ BACK BY AND VERIFIED WITH: JASON ROBBINS PHARMD 2139 07/09/2021 HNM    Enterococcus Faecium NOT DETECTED NOT DETECTED Final   Listeria monocytogenes NOT DETECTED NOT DETECTED Final   Staphylococcus species DETECTED (A) NOT DETECTED Final    Comment: CRITICAL RESULT CALLED TO, READ BACK BY AND VERIFIED WITH: JASON ROBBINS PHARMD 2139 06/24/2021 HNM    Staphylococcus aureus (BCID) NOT DETECTED NOT DETECTED Final   Staphylococcus epidermidis DETECTED (A) NOT DETECTED Final    Comment: Methicillin (oxacillin) resistant coagulase negative staphylococcus. Possible blood culture contaminant (unless  isolated from more than one blood culture draw or clinical case suggests pathogenicity). No antibiotic treatment is indicated for blood  culture contaminants. CRITICAL RESULT CALLED TO, READ BACK BY AND VERIFIED WITH: JASON ROBBINS PHARMD 2139 06/27/2021 HNM    Staphylococcus lugdunensis NOT DETECTED NOT DETECTED Final   Streptococcus species NOT DETECTED NOT DETECTED Final   Streptococcus agalactiae  NOT DETECTED NOT DETECTED Final   Streptococcus pneumoniae NOT DETECTED NOT DETECTED Final   Streptococcus pyogenes NOT DETECTED NOT DETECTED Final   A.calcoaceticus-baumannii NOT DETECTED NOT DETECTED Final   Bacteroides fragilis NOT DETECTED NOT DETECTED Final   Enterobacterales NOT DETECTED NOT DETECTED Final   Enterobacter cloacae complex NOT DETECTED NOT DETECTED Final   Escherichia coli NOT DETECTED NOT DETECTED Final   Klebsiella aerogenes NOT DETECTED NOT DETECTED Final   Klebsiella oxytoca NOT DETECTED NOT DETECTED Final   Klebsiella pneumoniae NOT DETECTED NOT DETECTED Final   Proteus species NOT DETECTED NOT DETECTED Final   Salmonella species NOT DETECTED NOT DETECTED Final   Serratia marcescens NOT DETECTED NOT DETECTED Final   Haemophilus influenzae NOT DETECTED NOT DETECTED Final   Neisseria meningitidis NOT DETECTED NOT DETECTED Final   Pseudomonas aeruginosa NOT DETECTED NOT DETECTED Final   Stenotrophomonas maltophilia NOT DETECTED NOT DETECTED Final   Candida albicans NOT DETECTED NOT DETECTED Final   Candida auris NOT DETECTED NOT DETECTED Final   Candida glabrata NOT DETECTED NOT DETECTED Final   Candida krusei NOT DETECTED NOT DETECTED Final   Candida parapsilosis NOT DETECTED NOT DETECTED Final   Candida tropicalis NOT DETECTED NOT DETECTED Final   Cryptococcus neoformans/gattii NOT DETECTED NOT DETECTED Final   Methicillin resistance mecA/C DETECTED (A) NOT DETECTED Final    Comment: CRITICAL RESULT CALLED TO, READ BACK BY AND VERIFIED WITH: JASON ROBBINS PHARMD 2139 06/21/2021 HNM    Vancomycin resistance NOT DETECTED NOT DETECTED Final    Comment: Performed at [facility], [address] [place], [place] [address]  Resp Panel by RT-PCR (Flu A&B, Covid) Nasopharyngeal Swab     Status: None   Collection Time: 07/02/2021  6:16 AM   Specimen: Nasopharyngeal Swab; Nasopharyngeal(NP) swabs in vial transport medium  Result Value Ref Range Status   SARS  Coronavirus 2 by RT PCR NEGATIVE NEGATIVE Final    Comment: (NOTE) SARS-CoV-2 target nucleic acids are NOT DETECTED.  The SARS-CoV-2 RNA is generally detectable in upper respiratory specimens during the acute phase of infection. The lowest concentration of SARS-CoV-2 viral copies this assay can detect is 138 copies/mL. A negative result does not preclude SARS-Cov-2 infection and should not be used as the sole basis for treatment or other patient management decisions. A negative result may occur with  improper specimen collection/handling, submission of specimen other than nasopharyngeal swab, presence of viral mutation(s) within the areas targeted by this assay, and inadequate number of viral copies(<138 copies/mL). A negative result must be combined with clinical observations, patient history, and epidemiological information. The expected result is Negative.  Fact Sheet for Patients:  EntrepreneurPulse.com.au  Fact Sheet for Healthcare Providers:  IncredibleEmployment.be  This test is no t yet approved or cleared by the [place] FDA and  has been authorized for detection and/or diagnosis of SARS-CoV-2 by FDA under an Emergency Use Authorization (EUA). This EUA will remain  in effect (meaning this test can be used) for the duration of the COVID-19 declaration under Section 564(b)(1) of the Act, 21 U.S.C.section 360bbb-3(b)(1), unless the authorization is terminated  or revoked sooner.  Influenza A by PCR NEGATIVE NEGATIVE Final   Influenza B by PCR NEGATIVE NEGATIVE Final    Comment: (NOTE) The Xpert Xpress SARS-CoV-2/FLU/RSV plus assay is intended as an aid in the diagnosis of influenza from Nasopharyngeal swab specimens and should not be used as a sole basis for treatment. Nasal washings and aspirates are unacceptable for Xpert Xpress SARS-CoV-2/FLU/RSV testing.  Fact Sheet for  Patients: EntrepreneurPulse.com.au  Fact Sheet for Healthcare Providers: IncredibleEmployment.be  This test is not yet approved or cleared by the [place] FDA and has been authorized for detection and/or diagnosis of SARS-CoV-2 by FDA under an Emergency Use Authorization (EUA). This EUA will remain in effect (meaning this test can be used) for the duration of the COVID-19 declaration under Section 564(b)(1) of the Act, 21 U.S.C. section 360bbb-3(b)(1), unless the authorization is terminated or revoked.  Performed at [facility], [address], [place], [place] [address]   Urine Culture     Status: None   Collection Time: 06/28/2021 12:00 PM   Specimen: Urine, Random  Result Value Ref Range Status   Specimen Description   Final    URINE, RANDOM Performed at [facility], [address] [place], [place] [address]    Special Requests   Final    NONE Performed at [facility], [address] [place], [place] [address]    Culture   Final    NO GROWTH Performed at [facility], [place] [address] [place], [place] [address]    Report Status 07/13/2021 FINAL  Final    Procedures and diagnostic studies:  CT HEAD WO CONTRAST (5MM)  Result Date: 06/24/2021 CLINICAL DATA:  Headache, intracranial hemorrhage suspected. Head trauma, minor (Age >= 65y). Mental status change, unknown cause; Neck trauma (Age >= 65y). Multiple falls. EXAM: CT HEAD WITHOUT CONTRAST CT CERVICAL SPINE WITHOUT CONTRAST TECHNIQUE: Multidetector CT imaging of the head and cervical spine was performed following the standard protocol without intravenous contrast. Multiplanar CT image reconstructions of the cervical spine were also generated. COMPARISON:  04/21/2019 head CT report from [facility] FINDINGS: CT HEAD FINDINGS Brain: There is no evidence of an acute infarct, acute intracranial hemorrhage, mass, or midline shift. There are small low-density  subdural fluid collections over both frontal convexities measuring up to 1 cm in thickness which were also described described in the 2020 CT report favoring chronic hygromas. These result in slight flattening of frontal gyri without other more significant mass effect. There is mild-to-moderate cerebral atrophy. Hypodensities in the cerebral white matter bilaterally are nonspecific but compatible with mild chronic small vessel ischemic disease. Vascular: Calcified atherosclerosis at the skull base. No hyperdense vessel. Skull: No acute fracture or suspicious osseous lesion. Sinuses/Orbits: Chronic left maxillary sinusitis with complete opacification of the sinus by partially calcified material and prominent osteitis. Clear mastoid air cells. Bilateral cataract extraction. Other: None. CT CERVICAL SPINE FINDINGS Alignment: Trace retrolisthesis of C5 on C6 and trace anterolisthesis of C6 on C7, C7 on T1, and T1 on T2. Skull base and vertebrae: No acute fracture or suspicious osseous lesion. Bulky anterior vertebral ossification from C3-C7 with solid interbody at C5-6 and C6-7. Soft tissues and spinal canal: No prevertebral fluid or swelling. No visible canal hematoma. Disc levels: Multilevel facet arthrosis, most severe on the right at C7-T1. Moderate multilevel neural foraminal stenosis due to uncovertebral and facet spurring. Mild multilevel spinal stenosis. Upper chest: Mild scarring in the lung apices. Other: Moderate calcific atherosclerosis at the carotid bifurcations. Partially visualized  pacemaker. IMPRESSION: 1. No evidence of acute intracranial abnormality. 2. Small chronic bilateral subdural hygromas. 3. Mild chronic small vessel ischemic disease and cerebral atrophy. 4. No acute cervical spine fracture. Electronically Signed   By: Logan Bores M.D.   On: 06/29/2021 07:04   CT Cervical Spine Wo Contrast  Result Date: 07/13/2021 CLINICAL DATA:  Headache, intracranial hemorrhage suspected. Head trauma,  minor (Age >= 65y). Mental status change, unknown cause; Neck trauma (Age >= 65y). Multiple falls. EXAM: CT HEAD WITHOUT CONTRAST CT CERVICAL SPINE WITHOUT CONTRAST TECHNIQUE: Multidetector CT imaging of the head and cervical spine was performed following the standard protocol without intravenous contrast. Multiplanar CT image reconstructions of the cervical spine were also generated. COMPARISON:  04/21/2019 head CT report from [facility] FINDINGS: CT HEAD FINDINGS Brain: There is no evidence of an acute infarct, acute intracranial hemorrhage, mass, or midline shift. There are small low-density subdural fluid collections over both frontal convexities measuring up to 1 cm in thickness which were also described described in the 2020 CT report favoring chronic hygromas. These result in slight flattening of frontal gyri without other more significant mass effect. There is mild-to-moderate cerebral atrophy. Hypodensities in the cerebral white matter bilaterally are nonspecific but compatible with mild chronic small vessel ischemic disease. Vascular: Calcified atherosclerosis at the skull base. No hyperdense vessel. Skull: No acute fracture or suspicious osseous lesion. Sinuses/Orbits: Chronic left maxillary sinusitis with complete opacification of the sinus by partially calcified material and prominent osteitis. Clear mastoid air cells. Bilateral cataract extraction. Other: None. CT CERVICAL SPINE FINDINGS Alignment: Trace retrolisthesis of C5 on C6 and trace anterolisthesis of C6 on C7, C7 on T1, and T1 on T2. Skull base and vertebrae: No acute fracture or suspicious osseous lesion. Bulky anterior vertebral ossification from C3-C7 with solid interbody at C5-6 and C6-7. Soft tissues and spinal canal: No prevertebral fluid or swelling. No visible canal hematoma. Disc levels: Multilevel facet arthrosis, most severe on the right at C7-T1. Moderate multilevel neural foraminal stenosis due to uncovertebral and facet spurring. Mild  multilevel spinal stenosis. Upper chest: Mild scarring in the lung apices. Other: Moderate calcific atherosclerosis at the carotid bifurcations. Partially visualized pacemaker. IMPRESSION: 1. No evidence of acute intracranial abnormality. 2. Small chronic bilateral subdural hygromas. 3. Mild chronic small vessel ischemic disease and cerebral atrophy. 4. No acute cervical spine fracture. Electronically Signed   By: Logan Bores M.D.   On: 06/17/2021 07:04   DG Chest Port 1 View  Result Date: 07/13/2021 CLINICAL DATA:  87 year old male with history of dyspnea. EXAM: PORTABLE CHEST 1 VIEW COMPARISON:  Chest x-ray 06/25/2021. FINDINGS: Lung volumes are low. Diffuse peribronchial cuffing, widespread interstitial prominence and patchy multifocal ill-defined airspace disease is noted in the lungs bilaterally, most severe in the right lung, where there is significantly worsened airspace consolidation. No definite pleural effusions. No pneumothorax. Cardiomegaly with cephalization of the pulmonary vasculature. Upper mediastinal contours are within normal limits. Atherosclerotic calcifications in the thoracic aorta. Left-sided pacemaker device in place with lead tips projecting over the expected location of the right ventricular apex. Status post median sternotomy. IMPRESSION: 1. Worsening bilateral bronchopneumonia (right greater than left), as above. 2. Cardiomegaly with pulmonary venous congestion. 3. Aortic atherosclerosis. Electronically Signed   By: Vinnie Langton M.D.   On: 07/13/2021 07:48   DG Chest Port 1 View  Result Date: 07/07/2021 CLINICAL DATA:  Questionable sepsis.  Pending labs. EXAM: PORTABLE CHEST 1 VIEW COMPARISON:  06/21/2016 FINDINGS: Indistinct bilateral airspace disease greatest at the  bases. No Kerley lines or pleural fluid. Chronic cardiomegaly. Prior median sternotomy and single chamber pacer implant from the left. IMPRESSION: Bilateral airspace disease with history suggesting pneumonia.  Electronically Signed   By: Jorje Guild M.D.   On: 07/09/2021 06:49   ECHOCARDIOGRAM COMPLETE  Result Date: 07/13/2021    ECHOCARDIOGRAM REPORT   Patient Name:   Steve Lloyd Date of Exam: 07/10/2021 Medical Rec #:  [id number]        Height:       67.0 in Accession #:    [id number]       Weight:       147.7 lb Date of Birth:  Dec 14, 1929         BSA:          1.778 m Patient Age:    34 years         BP:           125/77 mmHg Patient Gender: M                HR:           66 bpm. Exam Location:  [facility] Procedure: 2D Echo, Cardiac Doppler and Color Doppler Indications:     G40.10 Acute Diastolic CHF  History:         Patient has prior history of Echocardiogram examinations, most                  recent 06/16/2016. CAD, Pacemaker, Arrythmias:Atrial                  Fibrillation; Risk Factors:Diabetes.  Sonographer:     Cresenciano Lick RDCS Referring Phys:  [place] Diagnosing Phys: Kate Sable MD IMPRESSIONS  1. Left ventricular ejection fraction, by estimation, is 25 to 30%. The left ventricle has severely decreased function. The left ventricle demonstrates global hypokinesis. Left ventricular diastolic parameters are indeterminate.  2. Right ventricular systolic function is mildly reduced. The right ventricular size is normal. There is severely elevated pulmonary artery systolic pressure. The estimated right ventricular systolic pressure is 27.2 mmHg.  3. Left atrial size was severely dilated.  4. Right atrial size was severely dilated.  5. The mitral valve is normal in structure. Moderate mitral valve regurgitation.  6. Tricuspid valve regurgitation is moderate to severe.  7. The aortic valve is tricuspid. Aortic valve regurgitation is mild. Mild to moderate aortic valve sclerosis/calcification is present, without any evidence of aortic stenosis.  8. The inferior vena cava is dilated in size with <50% respiratory variability, suggesting right atrial pressure of 15 mmHg. FINDINGS  Left  Ventricle: Left ventricular ejection fraction, by estimation, is 25 to 30%. The left ventricle has severely decreased function. The left ventricle demonstrates global hypokinesis. The left ventricular internal cavity size was normal in size. There is no left ventricular hypertrophy. Left ventricular diastolic parameters are indeterminate. Right Ventricle: The right ventricular size is normal. No increase in right ventricular wall thickness. Right ventricular systolic function is mildly reduced. There is severely elevated pulmonary artery systolic pressure. The tricuspid regurgitant velocity is 3.63 m/s, and with an assumed right atrial pressure of 15 mmHg, the estimated right ventricular systolic pressure is 53.6 mmHg. Left Atrium: Left atrial size was severely dilated. Right Atrium: Right atrial size was severely dilated. Pericardium: There is no evidence of pericardial effusion. Mitral Valve: The mitral valve is normal in structure. Moderate mitral valve regurgitation. Tricuspid Valve: The tricuspid valve is normal in structure. Tricuspid valve regurgitation  is moderate to severe. Aortic Valve: The aortic valve is tricuspid. Aortic valve regurgitation is mild. Mild to moderate aortic valve sclerosis/calcification is present, without any evidence of aortic stenosis. Pulmonic Valve: The pulmonic valve was not well visualized. Pulmonic valve regurgitation is not visualized. Aorta: The aortic root is normal in size and structure. Venous: The inferior vena cava is dilated in size with less than 50% respiratory variability, suggesting right atrial pressure of 15 mmHg. IAS/Shunts: No atrial level shunt detected by color flow Doppler. Additional Comments: A device lead is visualized.  LEFT VENTRICLE PLAX 2D LVIDd:         5.50 cm LVIDs:         4.20 cm LV PW:         1.10 cm LV IVS:        0.80 cm LVOT diam:     1.80 cm LV SV:         32 LV SV Index:   18 LVOT Area:     2.54 cm  RIGHT VENTRICLE             IVC RV Basal  diam:  5.00 cm     IVC diam: 2.60 cm RV S prime:     15.05 cm/s TAPSE (M-mode): 1.5 cm LEFT ATRIUM              Index       RIGHT ATRIUM           Index LA diam:        5.60 cm  3.15 cm/m  RA Area:     32.10 cm LA Vol (A2C):   101.0 ml 56.81 ml/m RA Volume:   118.00 ml 66.37 ml/m LA Vol (A4C):   89.1 ml  50.12 ml/m LA Biplane Vol: 94.8 ml  53.32 ml/m  AORTIC VALVE LVOT Vmax:   68.75 cm/s LVOT Vmean:  50.900 cm/s LVOT VTI:    0.128 m  AORTA Ao Root diam: 3.50 cm MV E velocity: 85.17 cm/s  TRICUSPID VALVE                            TR Peak grad:   52.7 mmHg                            TR Vmax:        363.00 cm/s                             SHUNTS                            Systemic VTI:  0.13 m                            Systemic Diam: 1.80 cm Kate Sable MD Electronically signed by Kate Sable MD Signature Date/Time: 07/13/2021/1:06:02 PM    Final                LOS: 1 day   Jenicka Coxe  [facility]   Pager on [email or web address]. If 7PM-7AM, please contact night-coverage at [email or web address]     07/13/2021, 2:09 PM

## 2021-07-13 NOTE — TOC Initial Note (Addendum)
Transition of Care [facility]) - Initial/Assessment Note    Patient Details  Name: Steve Lloyd MRN: [id number] Date of Birth: 1930-08-17  [facility] CM/SW Contact:    Ova Freshwater Phone Number: [phone] 07/13/2021, 10:07 AM  Clinical Narrative:                  Patient presents to [facility] from home due to multiple falls.  Patient has dx of dementia.  Patient lives at home w/ spouse Boston Cookson [phone], who is the primary care giver and is unable to meet patient's care needs. Main contact is patient's daughter Lurena Joiner [phone].  Patient needs assistance w/ all ADLs.  PT recommended SNF. Patient does not have long term insurance. PASRR Pending  Expected Discharge Plan: Skilled Nursing Facility Barriers to Discharge: Continued Medical Work up, SNF Pending bed offer   Patient Goals and CMS Choice   CMS Medicare.gov Compare Post Acute Care list provided to:: Patient Represenative (must comment) Choice offered to / list presented to : Adult Children Lurena Joiner (Daughter)   [phone] [facility]))  Expected Discharge Plan and Services Expected Discharge Plan: [place] In-house Referral: Clinical Social Work   Post Acute Care Choice: [place] Living arrangements for the past 2 months: [place]                                      Prior Living Arrangements/Services Living arrangements for the past 2 months: Single Family Home Lives with:: Spouse Patient language and need for interpreter reviewed:: Yes Do you feel safe going back to the place where you live?: Yes      Need for Family Participation in Patient Care: Yes (Comment) Care giver support system in place?: Yes (comment)   Criminal Activity/Legal Involvement Pertinent to Current Situation/Hospitalization: No - Comment as needed  Activities of Daily Living      Permission Sought/Granted Permission sought to share information with  : Family Supports    Share Information with NAME: Lurena Joiner (Daughter)   [phone] (Mobile)           Emotional Assessment Appearance:: Appears stated age Attitude/Demeanor/Rapport: Unable to Assess Affect (typically observed): Unable to Assess Orientation: : Fluctuating Orientation (Suspected and/or reported Sundowners) Alcohol / Substance Use: Not Applicable Psych Involvement: No (comment)  Admission diagnosis:  CAP (community acquired pneumonia) [J18.9] Patient Active Problem List   Diagnosis Date Noted   CAP (community acquired pneumonia) 07/05/2021   Atrial fibrillation, chronic ([place]) 06/16/2021   Dementia ([place]) 07/11/2021   CKD (chronic kidney disease), stage IIIa 06/17/2021   Hypokalemia 06/23/2021   Elevated troponin 07/05/2021   Fall at home, initial encounter 57/32/2025   Acute metabolic encephalopathy 42/70/6237   Normocytic anemia 07/01/2021   Pacemaker 11/06/2018   S/P cardiac pacemaker procedure, [employer] 06/20/16  06/21/2016   Symptomatic bradycardia 06/14/2016   CAD s/p CABG LIMA to LAD 1998 10/24/2013   Hypercholesterolemia 10/24/2013   Essential hypertension 10/24/2013   Type II diabetes mellitus with renal manifestations ([place]) 10/24/2013   Atrial fibrillation with slow ventricular response ([place]) 01/01/2013   Long term current use of anticoagulant therapy 01/01/2013   PCP:  Everardo Pacific, MD Pharmacy:   [id number] [address], [place] [address] Phone: [phone] Fax: [phone]     Social Determinants of Health (SDOH) Interventions  Readmission Risk Interventions No flowsheet data found.   

## 2021-07-13 NOTE — ED Notes (Signed)
This RN at bedside to assess patient. Pt noted to be in resp distress. ERT at bedside as sitter. This RN not made aware.     Pt immediately placed on oxygen as saturation noted to be 86% on RA on mobile X2.   MD notified.   MD at bedside to assess, charge RN notified.

## 2021-07-13 NOTE — ED Notes (Signed)
Patient ate 6 oz. Of spaghetti with meat sauce along with 4 oz. Of water.  Patient said he did not want anymore food to eat.

## 2021-07-13 NOTE — Consult Note (Signed)
Cardiology Consultation:   Steve Lloyd ID: Steve Lloyd MRN: [id number]; DOB: Sep 01, 1930  Admit date: 06/22/2021 Date of Consult: 07/13/2021  PCP:  Everardo Pacific, MD   [facility] Providers Cardiologist:  Sanda Klein, MD        Steve Lloyd Profile:   Steve Lloyd is a 87 y.o. male with a hx of CAD/CABG, PPM, dementia who is being seen 07/13/2021 for the evaluation of elevated troponins at the request of Dr. Mal Misty  History of Present Illness:   Steve Lloyd CAD/CABG x1 in 1998 LIMA to LAD, permanent A. fib slow ventricular response, s/p single-chamber PPM 2017 presenting due to fall.  History obtained from wife and daughter due to condition of Steve Lloyd.  Daughter states Steve Lloyd has been more delirious over the last several weeks.  She thinks his dementia has gotten worse.  Had an episode of delirium in which he had bowel incontinence at home.  Steve Lloyd was walking around the house without help, leading to a fall.  He lives with his wife who is unable to take care of him.  Steve Lloyd brought to the hospital as he was not safe for him at home.  In the ED upon admission, chest x-ray showed possibility of pneumonia, Steve Lloyd started on antibiotics.  Initial lab work revealed hemoglobin 8.8.  Troponins were 355 up to 764.  EKG showed atrial fibrillation controlled ventricular response.  Home dose of Eliquis was held.  Due to comorbidities, heparin was held.   Past Medical History:  Diagnosis Date   Atrial fibrillation with slow ventricular response ([place])    hx/notes 06/20/2016   Coronary artery disease    Hyperlipidemia    Kidney stones    Pneumonia ~ 1950   Presence of permanent cardiac pacemaker    S/P cardiac pacemaker procedure, [employer] 06/20/16  06/21/2016   Type II diabetes mellitus ([place])     Past Surgical History:  Procedure Laterality Date   APPENDECTOMY     CATARACT EXTRACTION W/ INTRAOCULAR LENS  IMPLANT, BILATERAL Bilateral    CORONARY ARTERY BYPASS GRAFT   1998   single-vessel LIMA to LAD/notes 06/20/2016   EP IMPLANTABLE DEVICE N/A 06/20/2016   Procedure: Pacemaker Implant;  Surgeon: Sanda Klein, MD;  Location: [place] [facility];  Service: Cardiovascular;  Laterality: N/A;   INGUINAL HERNIA REPAIR Right    INSERT / REPLACE / REMOVE PACEMAKER  06/20/2016     Home Medications:  Prior to Admission medications   Medication Sig Start Date End Date Taking? Authorizing Provider  atorvastatin (LIPITOR) 10 MG tablet Take 10 mg by mouth daily.   Yes [provider]  benazepril (LOTENSIN) 10 MG tablet Take 10 mg by mouth daily.   Yes [provider]  ELIQUIS 5 MG TABS tablet TAKE 1 TABLET BY MOUTH TWICE A DAY 05/11/21  Yes Croitoru, Mihai, MD  sertraline (ZOLOFT) 50 MG tablet Take 50 mg by mouth daily. 07/17/19  Yes [provider]  amLODipine (NORVASC) 2.5 MG tablet Take 1 tablet (2.5 mg total) by mouth daily. Steve Lloyd not taking: No sig reported 04/25/21   Croitoru, Mihai, MD  Cyanocobalamin (VITAMIN B-12 IJ) Inject 1 mg as directed every 30 (thirty) days.     [provider]  donepezil (ARICEPT) 5 MG tablet Take by mouth. Steve Lloyd not taking: No sig reported 08/19/18   [provider]  Multiple Vitamin (MULTIVITAMIN) tablet Take 1 tablet by mouth daily. Steve Lloyd not taking: No sig reported    [provider]  rivastigmine (  EXELON) 4.6 mg/24hr 4.6 mg daily. 08/01/19   [provider]    Inpatient Medications: Scheduled Meds:  atorvastatin  10 mg Oral Daily   benazepril  5 mg Oral Daily   ipratropium-albuterol  3 mL Nebulization QID   LORazepam  0.5 mg Intravenous Once   rivastigmine  4.6 mg Transdermal Daily   sertraline  50 mg Oral Daily   Continuous Infusions:  azithromycin Stopped (07/13/21 1104)   [START ON 07/14/2021] vancomycin     PRN Meds: acetaminophen, albuterol, dextromethorphan-guaiFENesin, haloperidol lactate, hydrALAZINE, morphine injection, OLANZapine zydis,  ondansetron (ZOFRAN) IV  Allergies:   No Known Allergies  Social History:   Social History   Socioeconomic History   Marital status: Married    Spouse name: Not on file   Number of children: Not on file   Years of education: Not on file   Highest education level: Not on file  Occupational History   Not on file  Tobacco Use   Smoking status: Former    Packs/day: 1.00    Years: 8.00    Pack years: 8.00    Types: Cigarettes    Quit date: 10/15/1956    Years since quitting: 64.7   Smokeless tobacco: Never  Substance and Sexual Activity   Alcohol use: Yes    Alcohol/week: 14.0 standard drinks    Types: 14 Glasses of wine per week    Comment: 06/20/2016 "2 glasses of wine q night"   Drug use: No   Sexual activity: Not on file  Other Topics Concern   Not on file  Social History Narrative   Not on file   Social Determinants of Health   Financial Resource Strain: Not on file  Food Insecurity: Not on file  Transportation Needs: Not on file  Physical Activity: Not on file  Stress: Not on file  Social Connections: Not on file  Intimate Partner Violence: Not on file    Family History:   History reviewed. No pertinent family history.   ROS:  Please see the history of present illness.   All other ROS reviewed and negative.     Physical Exam/Data:   Vitals:   07/13/21 0635 07/13/21 0930 07/13/21 1200 07/13/21 1250  BP: 124/66 126/66 120/65   Pulse: 70 68 67 76  Resp: (!) 23 (!) 25 (!) 28 (!) 21  Temp:      TempSrc:      SpO2: 99% 98% 100% 99%  Weight:      Height:        Intake/Output Summary (Last 24 hours) at 07/13/2021 1254 Last data filed at 07/13/2021 1104 Gross per 24 hour  Intake 550 ml  Output --  Net 550 ml   Last 3 Weights 06/18/2021 04/25/2021 12/15/2019  Weight (lbs) 147 lb 11.3 oz 148 lb 9.6 oz 170 lb  Weight (kg) 67 kg 67.405 kg 77.111 kg     Body mass index is 23.13 kg/m.  General: No acute distress, nonsensical verbiage HEENT: normal Neck: no  JVD Vascular: No carotid bruits; Distal pulses 2+ bilaterally Cardiac: Irregularly irregular Lungs: Diminished breath sounds, rhonchi present Abd: soft, nontender, no hepatomegaly  Ext: no edema Musculoskeletal:  No deformities, BUE and BLE strength normal and equal Skin: warm and dry  Neuro: Demented, unable to assess Psych: Unable to assess  EKG:  The EKG was personally reviewed and demonstrates: Atrial fibrillation Telemetry:  Telemetry was personally reviewed and demonstrates: Atrial fibrillation  Relevant CV Studies: Echocardiogram 07/13/2021 1. Left ventricular ejection  fraction, by estimation, is 25 to 30%. The  left ventricle has severely decreased function. The left ventricle  demonstrates global hypokinesis. Left ventricular diastolic parameters are  indeterminate.   2. Right ventricular systolic function is mildly reduced. The right  ventricular size is normal. There is severely elevated pulmonary artery  systolic pressure. The estimated right ventricular systolic pressure is  75.1 mmHg.   3. Left atrial size was severely dilated.   4. Right atrial size was severely dilated.   5. The mitral valve is normal in structure. Moderate mitral valve  regurgitation.   6. Tricuspid valve regurgitation is moderate to severe.   7. The aortic valve is tricuspid. Aortic valve regurgitation is mild.  Mild to moderate aortic valve sclerosis/calcification is present, without  any evidence of aortic stenosis.   8. The inferior vena cava is dilated in size with <50% respiratory  variability, suggesting right atrial pressure of 15 mmHg.   Laboratory Data:  High Sensitivity Troponin:   Recent Labs  Lab 06/24/2021 0631 06/21/2021 0832 06/27/2021 1630 06/16/2021 1930 07/13/21 1011  TROPONINIHS 626* 101* 731* 764* 673*     Chemistry Recent Labs  Lab 06/16/2021 0616 07/04/2021 0832 07/13/21 0616  NA 135  --  137  K 3.4*  --  3.7  CL 101  --  103  CO2 21*  --  22  GLUCOSE 139*  --  155*   BUN 31*  --  30*  CREATININE 1.26*  --  1.35*  CALCIUM 8.5*  --  8.7*  MG  --  2.0  --   GFRNONAA 55*  --  50*  ANIONGAP 13  --  12    Recent Labs  Lab 06/19/2021 0616  PROT 6.1*  ALBUMIN 2.9*  AST 30  ALT 22  ALKPHOS 57  BILITOT 1.2   Lipids  Recent Labs  Lab 07/13/21 0616  CHOL 91  TRIG 74  HDL 36*  LDLCALC 40  CHOLHDL 2.5    Hematology Recent Labs  Lab 07/01/2021 0616 07/03/2021 0631 07/13/21 0616  WBC 8.5  --  12.1*  RBC 2.83*  --  2.99*  3.01*  HGB 9.1* 8.8* 9.3*  HCT 26.8* 26.4* 28.6*  MCV 94.7  --  95.7  MCH 32.2  --  31.1  MCHC 34.0  --  32.5  RDW 15.3  --  15.6*  PLT 176  --  198   Thyroid No results for input(s): TSH, FREET4 in the last 168 hours.  BNP Recent Labs  Lab 06/22/2021 0616  BNP 849.7*    DDimer No results for input(s): DDIMER in the last 168 hours.   Radiology/Studies:  CT HEAD WO CONTRAST (5MM)  Result Date: 07/03/2021 CLINICAL DATA:  Headache, intracranial hemorrhage suspected. Head trauma, minor (Age >= 65y). Mental status change, unknown cause; Neck trauma (Age >= 65y). Multiple falls. EXAM: CT HEAD WITHOUT CONTRAST CT CERVICAL SPINE WITHOUT CONTRAST TECHNIQUE: Multidetector CT imaging of the head and cervical spine was performed following the standard protocol without intravenous contrast. Multiplanar CT image reconstructions of the cervical spine were also generated. COMPARISON:  04/21/2019 head CT report from [facility] FINDINGS: CT HEAD FINDINGS Brain: There is no evidence of an acute infarct, acute intracranial hemorrhage, mass, or midline shift. There are small low-density subdural fluid collections over both frontal convexities measuring up to 1 cm in thickness which were also described described in the 2020 CT report favoring chronic hygromas. These result in slight flattening of frontal gyri without other more  significant mass effect. There is mild-to-moderate cerebral atrophy. Hypodensities in the cerebral white matter bilaterally are  nonspecific but compatible with mild chronic small vessel ischemic disease. Vascular: Calcified atherosclerosis at the skull base. No hyperdense vessel. Skull: No acute fracture or suspicious osseous lesion. Sinuses/Orbits: Chronic left maxillary sinusitis with complete opacification of the sinus by partially calcified material and prominent osteitis. Clear mastoid air cells. Bilateral cataract extraction. Other: None. CT CERVICAL SPINE FINDINGS Alignment: Trace retrolisthesis of C5 on C6 and trace anterolisthesis of C6 on C7, C7 on T1, and T1 on T2. Skull base and vertebrae: No acute fracture or suspicious osseous lesion. Bulky anterior vertebral ossification from C3-C7 with solid interbody at C5-6 and C6-7. Soft tissues and spinal canal: No prevertebral fluid or swelling. No visible canal hematoma. Disc levels: Multilevel facet arthrosis, most severe on the right at C7-T1. Moderate multilevel neural foraminal stenosis due to uncovertebral and facet spurring. Mild multilevel spinal stenosis. Upper chest: Mild scarring in the lung apices. Other: Moderate calcific atherosclerosis at the carotid bifurcations. Partially visualized pacemaker. IMPRESSION: 1. No evidence of acute intracranial abnormality. 2. Small chronic bilateral subdural hygromas. 3. Mild chronic small vessel ischemic disease and cerebral atrophy. 4. No acute cervical spine fracture. Electronically Signed   By: Logan Bores M.D.   On: 07/10/2021 07:04   CT Cervical Spine Wo Contrast  Result Date: 07/09/2021 CLINICAL DATA:  Headache, intracranial hemorrhage suspected. Head trauma, minor (Age >= 65y). Mental status change, unknown cause; Neck trauma (Age >= 65y). Multiple falls. EXAM: CT HEAD WITHOUT CONTRAST CT CERVICAL SPINE WITHOUT CONTRAST TECHNIQUE: Multidetector CT imaging of the head and cervical spine was performed following the standard protocol without intravenous contrast. Multiplanar CT image reconstructions of the cervical spine were  also generated. COMPARISON:  04/21/2019 head CT report from [facility] FINDINGS: CT HEAD FINDINGS Brain: There is no evidence of an acute infarct, acute intracranial hemorrhage, mass, or midline shift. There are small low-density subdural fluid collections over both frontal convexities measuring up to 1 cm in thickness which were also described described in the 2020 CT report favoring chronic hygromas. These result in slight flattening of frontal gyri without other more significant mass effect. There is mild-to-moderate cerebral atrophy. Hypodensities in the cerebral white matter bilaterally are nonspecific but compatible with mild chronic small vessel ischemic disease. Vascular: Calcified atherosclerosis at the skull base. No hyperdense vessel. Skull: No acute fracture or suspicious osseous lesion. Sinuses/Orbits: Chronic left maxillary sinusitis with complete opacification of the sinus by partially calcified material and prominent osteitis. Clear mastoid air cells. Bilateral cataract extraction. Other: None. CT CERVICAL SPINE FINDINGS Alignment: Trace retrolisthesis of C5 on C6 and trace anterolisthesis of C6 on C7, C7 on T1, and T1 on T2. Skull base and vertebrae: No acute fracture or suspicious osseous lesion. Bulky anterior vertebral ossification from C3-C7 with solid interbody at C5-6 and C6-7. Soft tissues and spinal canal: No prevertebral fluid or swelling. No visible canal hematoma. Disc levels: Multilevel facet arthrosis, most severe on the right at C7-T1. Moderate multilevel neural foraminal stenosis due to uncovertebral and facet spurring. Mild multilevel spinal stenosis. Upper chest: Mild scarring in the lung apices. Other: Moderate calcific atherosclerosis at the carotid bifurcations. Partially visualized pacemaker. IMPRESSION: 1. No evidence of acute intracranial abnormality. 2. Small chronic bilateral subdural hygromas. 3. Mild chronic small vessel ischemic disease and cerebral atrophy. 4. No acute cervical  spine fracture. Electronically Signed   By: Logan Bores M.D.   On: 06/21/2021 07:04   DG  Chest Port 1 View  Result Date: 07/13/2021 CLINICAL DATA:  87 year old male with history of dyspnea. EXAM: PORTABLE CHEST 1 VIEW COMPARISON:  Chest x-ray 06/28/2021. FINDINGS: Lung volumes are low. Diffuse peribronchial cuffing, widespread interstitial prominence and patchy multifocal ill-defined airspace disease is noted in the lungs bilaterally, most severe in the right lung, where there is significantly worsened airspace consolidation. No definite pleural effusions. No pneumothorax. Cardiomegaly with cephalization of the pulmonary vasculature. Upper mediastinal contours are within normal limits. Atherosclerotic calcifications in the thoracic aorta. Left-sided pacemaker device in place with lead tips projecting over the expected location of the right ventricular apex. Status post median sternotomy. IMPRESSION: 1. Worsening bilateral bronchopneumonia (right greater than left), as above. 2. Cardiomegaly with pulmonary venous congestion. 3. Aortic atherosclerosis. Electronically Signed   By: Vinnie Langton M.D.   On: 07/13/2021 07:48   DG Chest Port 1 View  Result Date: 07/02/2021 CLINICAL DATA:  Questionable sepsis.  Pending labs. EXAM: PORTABLE CHEST 1 VIEW COMPARISON:  06/21/2016 FINDINGS: Indistinct bilateral airspace disease greatest at the bases. No Kerley lines or pleural fluid. Chronic cardiomegaly. Prior median sternotomy and single chamber pacer implant from the left. IMPRESSION: Bilateral airspace disease with history suggesting pneumonia. Electronically Signed   By: Jorje Guild M.D.   On: 07/13/2021 06:49     Assessment and Plan:   Cardiomyopathy, EF 25 to 30% -Not a candidate for ischemia evaluation due to anemia, severe dementia -Discussed at length with daughter and wife, medical management for now. -Reduce PTA dose of benazepril to 5 mg daily -Start Toprol-XL 12.5 mg daily, titrate as BP  permits -Appears euvolemic but pulmonary hypertension noted on echo.  Start low-dose daily Lasix.  2.  Elevated troponins, history of CAD/CABG -Not a candidate for anticoagulation or invasive work-up as mentioned above -Last troponin 764  3.  Permanent A. fib, s/p PPM -Challenging situation.  Due to anemia.  Continue to hold Eliquis for now -Monitor anemia  4.  Dementia, altered mental status, agitation -Wife cannot take care of Steve Lloyd at home -Will likely need support services, skilled nursing facility -Management/coordination as per primary team with case management.   Total encounter time 110 minutes  Greater than 50% was spent in counseling and coordination of care with the Steve Lloyd   Signed, Kate Sable, MD  07/13/2021 12:54 PM

## 2021-07-13 NOTE — ED Notes (Signed)
Pt continues to attack sitter, constant attempts to throw self out of bed. Pt given PRN meds. MD notified.

## 2021-07-13 NOTE — NC FL2 (Signed)
[place] LEVEL OF CARE SCREENING TOOL     IDENTIFICATION  Patient Name: Steve Lloyd Birthdate: 05-02-1930 Sex: male Admission Date (Current Location): 07/11/2021  [facility] and [place] Number:  Engineering geologist and Address:  [facility], [address] [place], [place] [address]      Provider Number: [id number]  Attending Physician Name and Address:  Jennye Boroughs, MD  Relative Name and Phone Number:  Lurena Joiner (Daughter)   [phone] [facility])    Current Level of Care: Hospital Recommended Level of Care: [place], [place] Prior Approval Number:    Date Approved/Denied:   PASRR Number: Pending  Discharge Plan: SNF    Current Diagnoses: Patient Active Problem List   Diagnosis Date Noted   CAP (community acquired pneumonia) 07/14/2021   Atrial fibrillation, chronic ([place]) 07/07/2021   Dementia ([place]) 06/29/2021   CKD (chronic kidney disease), stage IIIa 07/09/2021   Hypokalemia 06/22/2021   Elevated troponin 06/28/2021   Fall at home, initial encounter 99/24/2683   Acute metabolic encephalopathy 41/96/2229   Normocytic anemia 07/09/2021   Pacemaker 11/06/2018   S/P cardiac pacemaker procedure, [employer] 06/20/16  06/21/2016   Symptomatic bradycardia 06/14/2016   CAD s/p CABG LIMA to LAD 1998 10/24/2013   Hypercholesterolemia 10/24/2013   Essential hypertension 10/24/2013   Type II diabetes mellitus with renal manifestations ([place]) 10/24/2013   Atrial fibrillation with slow ventricular response ([place]) 01/01/2013   Long term current use of anticoagulant therapy 01/01/2013    Orientation RESPIRATION BLADDER Height & Weight     Self  Normal Incontinent Weight: 147 lb 11.3 oz (67 kg) Height:  5\' 7"  (170.2 cm)  BEHAVIORAL SYMPTOMS/MOOD NEUROLOGICAL BOWEL NUTRITION STATUS  [place], Other (Comment) (irritable)   Incontinent Diet  AMBULATORY STATUS COMMUNICATION OF NEEDS Skin    Extensive Assist Verbally Normal                       Personal Care Assistance Level of Assistance  Bathing, Feeding, Dressing, Total care Bathing Assistance: Limited assistance Feeding assistance: Limited assistance Dressing Assistance: Limited assistance Total Care Assistance: Maximum assistance   Functional Limitations Info  Sight, Speech, Hearing Sight Info: Adequate Hearing Info: Adequate Speech Info: Adequate    SPECIAL CARE FACTORS FREQUENCY  PT (By licensed PT), OT (By licensed OT)     PT Frequency: 5X per week OT Frequency: 5X per week            Contractures Contractures Info: Not present    Additional Factors Info                  Current Medications (07/13/2021):  This is the current hospital active medication list Current Facility-Administered Medications  Medication Dose Route Frequency Provider Last Rate Last Admin   acetaminophen (TYLENOL) tablet 650 mg  650 mg Oral Q6H PRN Ivor Costa, MD       albuterol (PROVENTIL) (2.5 MG/3ML) 0.083% nebulizer solution 2.5 mg  2.5 mg Nebulization Q4H PRN Ivor Costa, MD       atorvastatin (LIPITOR) tablet 10 mg  10 mg Oral Daily Ivor Costa, MD   10 mg at 07/13/21 0920   azithromycin (ZITHROMAX) 500 mg in sodium chloride 0.9 % 250 mL IVPB  500 mg Intravenous Q24H Ivor Costa, MD   Stopped at 07/13/21 1104   benazepril (LOTENSIN) tablet 5 mg  5 mg Oral Daily Kate Sable, MD   5 mg at 07/13/21 [phone]  dextromethorphan-guaiFENesin ([place] DM) 30-600 MG per 12 hr tablet 1 tablet  1 tablet Oral BID PRN Ivor Costa, MD       haloperidol lactate (HALDOL) injection 2 mg  2 mg Intramuscular Q6H PRN Mansy, Jan A, MD   2 mg at 07/13/21 0346   hydrALAZINE (APRESOLINE) injection 5 mg  5 mg Intravenous Q2H PRN Ivor Costa, MD       ipratropium-albuterol (DUONEB) 0.5-2.5 (3) MG/3ML nebulizer solution 3 mL  3 mL Nebulization QID Mansy, Jan A, MD   3 mL at 07/13/21 0929   LORazepam (ATIVAN) injection 0.5 mg  0.5 mg  Intravenous Once Mansy, Jan A, MD       morphine 2 MG/ML injection 1 mg  1 mg Intravenous Q2H PRN Mansy, Jan A, MD   1 mg at 07/13/21 0105   OLANZapine zydis (ZYPREXA) disintegrating tablet 2.5 mg  2.5 mg Oral Q6H PRN Mansy, Jan A, MD   2.5 mg at 07/13/21 0253   ondansetron (ZOFRAN) injection 4 mg  4 mg Intravenous Q8H PRN Ivor Costa, MD       rivastigmine (EXELON) 4.6 mg/24hr 4.6 mg  4.6 mg Transdermal Daily Ivor Costa, MD       sertraline (ZOLOFT) tablet 50 mg  50 mg Oral Daily Ivor Costa, MD   50 mg at 07/13/21 0922   [START ON 07/14/2021] vancomycin (VANCOREADY) IVPB 1750 mg/350 mL  1,750 mg Intravenous Q48H Ivor Costa, MD       Current Outpatient Medications  Medication Sig Dispense Refill   atorvastatin (LIPITOR) 10 MG tablet Take 10 mg by mouth daily.     benazepril (LOTENSIN) 10 MG tablet Take 10 mg by mouth daily.     ELIQUIS 5 MG TABS tablet TAKE 1 TABLET BY MOUTH TWICE A DAY 180 tablet 1   sertraline (ZOLOFT) 50 MG tablet Take 50 mg by mouth daily.     amLODipine (NORVASC) 2.5 MG tablet Take 1 tablet (2.5 mg total) by mouth daily. (Patient not taking: No sig reported) 90 tablet 3   Cyanocobalamin (VITAMIN B-12 IJ) Inject 1 mg as directed every 30 (thirty) days.      donepezil (ARICEPT) 5 MG tablet Take by mouth. (Patient not taking: No sig reported)     Multiple Vitamin (MULTIVITAMIN) tablet Take 1 tablet by mouth daily. (Patient not taking: No sig reported)     rivastigmine (EXELON) 4.6 mg/24hr 4.6 mg daily.       Discharge Medications: Please see discharge summary for a list of discharge medications.  Relevant Imaging Results:  Relevant Lab Results:   Additional Information SS# [id number]  Adelene Amas, LCSWA

## 2021-07-13 NOTE — Evaluation (Signed)
Occupational Therapy Evaluation Patient Details Name: Steve Lloyd MRN: [id number] DOB: [birth year] Today's Date: 07/13/2021   History of Present Illness 87 y.o. male with a history as listed below which includes some mild dementia and who presents with acute worsening mental status.  His family reported to paramedics that he had a fall about 4 days ago and was evaluated and medically cleared.  However since that time he has gradually worsened and his altered mental status has become severe tonight.  Family reports that since then he has had multiple falls as and been ambulating around the house urinating and defecating "all over." Pt w/u for bilateral PNA.   Clinical Impression   Pt seen for OT evaluation this date in setting of acute hospitalization d/t increased confusion 2/2 PNA. Pt is poor historian 2/2 confusion. He presents this date with decreased activity tolerance, decreased strength, and decreased safety awareness On ADL assessment, pt requires: SETUP to MIN A for seated UB ADLs (primarily for cognitive reasons). Pt requires MIN A for ADL transfers. MOD A for seated LB ADLs such as donning socks. Pt requires MAX A for toileting and peri care after loose BM and MIN/MOD A for thorough completion of hand hygiene standing sink-side with RW. Pt requires visual/tactile cues to initiate and seuqence. Pt returned to bed end of session. Increased time spent calming pt d/t confusion and some periods of anxiety during session as well as time spent assisting with extensive peri care d/t loose stools. Will continue to follow acutely. Anticipate pt will require STR services as he cannot safely manage at home at this time d/t increased risk for falls.     Recommendations for follow up therapy are one component of a multi-disciplinary discharge planning process, led by the attending physician.  Recommendations may be updated based on patient status, additional functional criteria and insurance  authorization.   Follow Up Recommendations  SNF    Equipment Recommendations  3 in 1 bedside commode;Tub/shower seat;Other (comment) (2ww)    Recommendations for Other Services       Precautions / Restrictions Precautions Precautions: Fall Restrictions Weight Bearing Restrictions: No      Mobility Bed Mobility Overal bed mobility: Needs Assistance Bed Mobility: Supine to Sit;Sit to Supine     Supine to sit: Min assist;HOB elevated Sit to supine: Min assist   General bed mobility comments: increased time, cues for sequence    Transfers Overall transfer level: Needs assistance Equipment used: Rolling walker (2 wheeled) Transfers: Sit to/from Stand Sit to Stand: Min assist         General transfer comment: increased time, cues for safety, requires assist to steady, poor safety awareness/awareness of hazards.    Balance Overall balance assessment: Needs assistance Sitting-balance support: Feet supported Sitting balance-Leahy Scale: Good     Standing balance support: Bilateral upper extremity supported Standing balance-Leahy Scale: Fair Standing balance comment: Requires UE support, initially fair, but as pt fatigues after toileting, balance becomes more poor.                           ADL either performed or assessed with clinical judgement   ADL Overall ADL's : Needs assistance/impaired                                       General ADL Comments: SETUP to MIN A for seated UB  ADLs (primarily for cognitive reasons). Pt requires MIN A for ADL transfers. MOD A for seated LB ADLs such as donning socks. Pt requires MAX A for toileting and peri care after loose BM and MIN/MOD A for thorough completion of hand hygiene standing sink-side with RW. Pt requires visual/tactile cues to initiate and seuqence.     Vision Patient Visual Report: No change from baseline Additional Comments: difficult to formally assess.     Perception     Praxis       Pertinent Vitals/Pain Pain Assessment: No/denies pain     Hand Dominance     Extremity/Trunk Assessment Upper Extremity Assessment Upper Extremity Assessment: Generalized weakness;Difficult to assess due to impaired cognition   Lower Extremity Assessment Lower Extremity Assessment: Generalized weakness;Difficult to assess due to impaired cognition       Communication Communication Communication: No difficulties   Cognition Arousal/Alertness: Awake/alert Behavior During Therapy: Impulsive Overall Cognitive Status: Difficult to assess                                 General Comments: h/o dementia, per phone call with daughter he has better and worse days but can typically state the date and have some awareness, today he has no temporal or situational awareness and reports needing to get back to work.  Pt here for confusion which has persisted   General Comments       Exercises     Shoulder Instructions      Home Living Family/patient expects to be discharged to:: Unsure                                 Additional Comments: Pt lives with 5 y/o wife, apparently has been needing more and more assistance with increased confusion recently (both in frequency and severity)      Prior Functioning/Environment Level of Independence: Needs assistance  Gait / Transfers Assistance Needed: apparently he has not traditionally needed a lot of phyiscal assist, cruises on the funiture and only rarely will use walker.  Increased unsteadiness reported by family recently.              OT Problem List: Decreased strength;Decreased activity tolerance;Impaired balance (sitting and/or standing);Decreased cognition;Decreased safety awareness;Decreased knowledge of use of DME or AE;Cardiopulmonary status limiting activity      OT Treatment/Interventions: Self-care/ADL training;Therapeutic exercise;DME and/or AE instruction;Therapeutic activities;Patient/family  education;Balance training    OT Goals(Current goals can be found in the care plan section) Acute Rehab OT Goals Patient Stated Goal: daughter hoping pt can go to rehab to get safer and more steady OT Goal Formulation: With family Time For Goal Achievement: 07/27/21 Potential to Achieve Goals: Good ADL Goals Pt Will Perform Grooming: with set-up;sitting (with <10% visual/verbal cues for sequencing) Pt Will Transfer to Toilet: with supervision;ambulating (with LRAD PRN to/from restroom with <10% verbal cues for safety.) Pt Will Perform Toileting - Clothing Manipulation and hygiene: with min guard assist;sit to/from stand Additional ADL Goal #1: Pt will complete 1 familliar ADL task in sitting with <10% cues.  OT Frequency: Min 1X/week   Barriers to D/C:            Co-evaluation              AM-PAC OT "6 Clicks" Daily Activity     Outcome Measure Help from another person eating meals?: A Little Help  from another person taking care of personal grooming?: A Little Help from another person toileting, which includes using toliet, bedpan, or urinal?: A Lot Help from another person bathing (including washing, rinsing, drying)?: A Lot Help from another person to put on and taking off regular upper body clothing?: A Little Help from another person to put on and taking off regular lower body clothing?: A Lot 6 Click Score: 15   End of Session Equipment Utilized During Treatment: Gait belt;Rolling walker;Oxygen Nurse Communication: Mobility status  Activity Tolerance: Patient tolerated treatment well Patient left: in bed;with call bell/phone within reach;with nursing/sitter in room  OT Visit Diagnosis: Unsteadiness on feet (R26.81);Muscle weakness (generalized) (M62.81);Other symptoms and signs involving cognitive function                Time: 1521-1610 OT Time Calculation (min): 49 min Charges:  OT General Charges $OT Visit: 1 Visit OT Evaluation $OT Eval Moderate Complexity: 1  Mod OT Treatments $Self Care/Home Management : 8-22 mins $Therapeutic Activity: 8-22 mins  Gerrianne Scale, MS, OTR/L ascom [phone] 07/13/21, 5:55 PM

## 2021-07-13 NOTE — ED Notes (Signed)
OT at bedside. 

## 2021-07-13 NOTE — ED Notes (Signed)
Pt given meal tray.

## 2021-07-13 NOTE — ED Notes (Signed)
This RN at bedside to round, pt noted to have both feet swung over side of bed attempting to get out of bed despite ERT at bedside. This RN repositions patient.

## 2021-07-13 NOTE — ED Notes (Signed)
MD notified of patient increased agitation & acute panicked state. Pt continuously trying to crawl OOB, remove monitoring equipment.

## 2021-07-13 NOTE — ED Notes (Signed)
Lab called to draw labs due to being hard stick and trouble pulling back from IV

## 2021-07-13 NOTE — Consult Note (Addendum)
NAME: Steve Lloyd  DOB: [birth year]  MRN: [id number]  Date/Time: 07/13/2021 11:54 AM :  REQUESTING PROVIDERDr.Ayiku Subjective:  REASON FOR CONSULT: bacteremia ?No history available from patient as he is confused Steve Lloyd is a 87 y.o. male with a history of Afib, permanent pacemaker, CAD, S/P CABG HLD, DM , dementia presents from home with frequent falls. He had a fall 4 days ago and was evaluated by paramedics and was fine, but h started to become confused , with more falls, and being incontinent and wife could not handle him at home. Vitals in the ED on 06/30/2021 was BP 100/63, Temp 97.2, Pulse 73 and RR 32, sats 90% WBC 8.5, HB 8.8, PLT 176 and r 1.26, NA 135 and LFTS N. CT head and cervical spine showed No evidence of acute intracranial abnormality. 2. Small chronic bilateral subdural hygromas. 3. Mild chronic small vessel ischemic disease and cerebral atrophy. 4. No acute cervical spine fracture.  UA had no wbc Blood culture sent ( 1 set) and he was started on azithromycin and ceftriaxone for possible pneumonia- As hte blood culture came back positive for enterococus I am seeing the patient .    Past Medical History:  Diagnosis Date   Atrial fibrillation with slow ventricular response ([place])    hx/notes 06/20/2016   Coronary artery disease    Hyperlipidemia    Kidney stones    Pneumonia ~ 1950   Presence of permanent cardiac pacemaker    S/P cardiac pacemaker procedure, [employer] 06/20/16  06/21/2016   Type II diabetes mellitus ([place])     Past Surgical History:  Procedure Laterality Date   APPENDECTOMY     CATARACT EXTRACTION W/ INTRAOCULAR LENS  IMPLANT, BILATERAL Bilateral    CORONARY ARTERY BYPASS GRAFT  1998   single-vessel LIMA to LAD/notes 06/20/2016   EP IMPLANTABLE DEVICE N/A 06/20/2016   Procedure: Pacemaker Implant;  Surgeon: Sanda Klein, MD;  Location: [facility];  Service: Cardiovascular;  Laterality: N/A;   INGUINAL HERNIA REPAIR Right     INSERT / REPLACE / REMOVE PACEMAKER  06/20/2016    Social History   Socioeconomic History   Marital status: Married    Spouse name: Not on file   Number of children: Not on file   Years of education: Not on file   Highest education level: Not on file  Occupational History   Not on file  Tobacco Use   Smoking status: Former    Packs/day: 1.00    Years: 8.00    Pack years: 8.00    Types: Cigarettes    Quit date: 10/15/1956    Years since quitting: 64.7   Smokeless tobacco: Never  Substance and Sexual Activity   Alcohol use: Yes    Alcohol/week: 14.0 standard drinks    Types: 14 Glasses of wine per week    Comment: 06/20/2016 "2 glasses of wine q night"   Drug use: No   Sexual activity: Not on file  Other Topics Concern   Not on file  Social History Narrative   Not on file   Social Determinants of Health   Financial Resource Strain: Not on file  Food Insecurity: Not on file  Transportation Needs: Not on file  Physical Activity: Not on file  Stress: Not on file  Social Connections: Not on file  Intimate Partner Violence: Not on file    History reviewed. No pertinent family history. No Known Allergies I? Current Facility-Administered Medications  Medication Dose Route Frequency  Provider Last Rate Last Admin   acetaminophen (TYLENOL) tablet 650 mg  650 mg Oral Q6H PRN Ivor Costa, MD       albuterol (PROVENTIL) (2.5 MG/3ML) 0.083% nebulizer solution 2.5 mg  2.5 mg Nebulization Q4H PRN Ivor Costa, MD       atorvastatin (LIPITOR) tablet 10 mg  10 mg Oral Daily Ivor Costa, MD   10 mg at 07/13/21 0920   azithromycin (ZITHROMAX) 500 mg in sodium chloride 0.9 % 250 mL IVPB  500 mg Intravenous Q24H Ivor Costa, MD   Stopped at 07/13/21 1104   benazepril (LOTENSIN) tablet 5 mg  5 mg Oral Daily Kate Sable, MD   5 mg at 07/13/21 3976   dextromethorphan-guaiFENesin ([place] DM) 30-600 MG per 12 hr tablet 1 tablet  1 tablet Oral BID PRN Ivor Costa, MD       haloperidol lactate  (HALDOL) injection 2 mg  2 mg Intramuscular Q6H PRN Mansy, Jan A, MD   2 mg at 07/13/21 0346   hydrALAZINE (APRESOLINE) injection 5 mg  5 mg Intravenous Q2H PRN Ivor Costa, MD       ipratropium-albuterol (DUONEB) 0.5-2.5 (3) MG/3ML nebulizer solution 3 mL  3 mL Nebulization QID Mansy, Jan A, MD   3 mL at 07/13/21 0929   LORazepam (ATIVAN) injection 0.5 mg  0.5 mg Intravenous Once Mansy, Jan A, MD       morphine 2 MG/ML injection 1 mg  1 mg Intravenous Q2H PRN Mansy, Jan A, MD   1 mg at 07/13/21 0105   OLANZapine zydis (ZYPREXA) disintegrating tablet 2.5 mg  2.5 mg Oral Q6H PRN Mansy, Jan A, MD   2.5 mg at 07/13/21 0253   ondansetron (ZOFRAN) injection 4 mg  4 mg Intravenous Q8H PRN Ivor Costa, MD       rivastigmine (EXELON) 4.6 mg/24hr 4.6 mg  4.6 mg Transdermal Daily Ivor Costa, MD       sertraline (ZOLOFT) tablet 50 mg  50 mg Oral Daily Ivor Costa, MD   50 mg at 07/13/21 0922   [START ON 07/14/2021] vancomycin (VANCOREADY) IVPB 1750 mg/350 mL  1,750 mg Intravenous Q48H Ivor Costa, MD       Current Outpatient Medications  Medication Sig Dispense Refill   atorvastatin (LIPITOR) 10 MG tablet Take 10 mg by mouth daily.     benazepril (LOTENSIN) 10 MG tablet Take 10 mg by mouth daily.     ELIQUIS 5 MG TABS tablet TAKE 1 TABLET BY MOUTH TWICE A DAY 180 tablet 1   sertraline (ZOLOFT) 50 MG tablet Take 50 mg by mouth daily.     amLODipine (NORVASC) 2.5 MG tablet Take 1 tablet (2.5 mg total) by mouth daily. (Patient not taking: No sig reported) 90 tablet 3   Cyanocobalamin (VITAMIN B-12 IJ) Inject 1 mg as directed every 30 (thirty) days.      donepezil (ARICEPT) 5 MG tablet Take by mouth. (Patient not taking: No sig reported)     Multiple Vitamin (MULTIVITAMIN) tablet Take 1 tablet by mouth daily. (Patient not taking: No sig reported)     rivastigmine (EXELON) 4.6 mg/24hr 4.6 mg daily.       Abtx:  Anti-infectives (From admission, onward)    Start     Dose/Rate Route Frequency Ordered Stop    07/14/21 2200  vancomycin (VANCOREADY) IVPB 1750 mg/350 mL        1,750 mg 175 mL/hr over 120 Minutes Intravenous Every 48 hours 06/20/2021 2259  07/10/2021 2200  vancomycin (VANCOREADY) IVPB 1500 mg/300 mL        1,500 mg 150 mL/hr over 120 Minutes Intravenous  Once 06/27/2021 2153 07/13/21 0024   06/27/2021 0745  cefTRIAXone (ROCEPHIN) 2 g in sodium chloride 0.9 % 100 mL IVPB  Status:  Discontinued        2 g 200 mL/hr over 30 Minutes Intravenous Every 24 hours 06/26/2021 0743 06/29/2021 2154   07/07/2021 0745  azithromycin (ZITHROMAX) 500 mg in sodium chloride 0.9 % 250 mL IVPB        500 mg 250 mL/hr over 60 Minutes Intravenous Every 24 hours 07/14/2021 0743 07/17/21 0744       REVIEW OF SYSTEMS:  NA Objective:  VITALS:  BP 126/66   Pulse 68   Temp (!) 97.2 F (36.2 C) (Rectal)   Resp (!) 25   Ht 5\' 7"  (1.702 m)   Wt 67 kg   SpO2 98%   BMI 23.13 kg/m  PHYSICAL EXAM:  General: awake, confused, does not follow commands, taling incessantly but incomprehensible Head: Normocephalic, without obvious abnormality, atraumatic. Eyes: Conjunctivae clear, anicteric sclerae. Pupils are equal ENT did not examine Neck: Supple, symmetrical, no adenopathy, thyroid: non tender no carotid bruit and no JVD.  Lungs: b/l air entry- crepts bases Heart: 2/6 systolic murmur- irregular, pacemaker site okay Abdomen: Soft, non-tender,not distended. Bowel sounds normal. No masses Extremities: abrasions over rt shin Skin: No rashes or lesions. Or bruising Lymph: Cervical, supraclavicular normal. Neurologic: moves all limbs Pertinent Labs Lab Results CBC    Component Value Date/Time   WBC 12.1 (H) 07/13/2021 0616   RBC 2.99 (L) 07/13/2021 0616   RBC 3.01 (L) 07/13/2021 0616   HGB 9.3 (L) 07/13/2021 0616   HGB 14.0 12/15/2019 0855   HCT 28.6 (L) 07/13/2021 0616   HCT 41.3 12/15/2019 0855   PLT 198 07/13/2021 0616   PLT 183 12/15/2019 0855   MCV 95.7 07/13/2021 0616   MCV 101 (H) 12/15/2019 0855    MCH 31.1 07/13/2021 0616   MCHC 32.5 07/13/2021 0616   RDW 15.6 (H) 07/13/2021 0616   RDW 12.3 12/15/2019 0855   LYMPHSABS 0.9 06/19/2021 0616   MONOABS 0.9 07/06/2021 0616   EOSABS 0.1 06/18/2021 0616   BASOSABS 0.0 07/04/2021 0616    CMP Latest Ref Rng & Units 07/13/2021 07/04/2021 12/15/2019  Glucose 70 - 99 mg/dL 155(H) 139(H) 99  BUN 8 - 23 mg/dL 30(H) 31(H) 26  Creatinine 0.61 - 1.24 mg/dL 1.35(H) 1.26(H) 1.19  Sodium 135 - 145 mmol/L 137 135 139  Potassium 3.5 - 5.1 mmol/L 3.7 3.4(L) 5.0  Chloride 98 - 111 mmol/L 103 101 104  CO2 22 - 32 mmol/L 22 21(L) 22  Calcium 8.9 - 10.3 mg/dL 8.7(L) 8.5(L) 9.4  Total Protein 6.5 - 8.1 g/dL - 6.1(L) 7.1  Total Bilirubin 0.3 - 1.2 mg/dL - 1.2 0.6  Alkaline Phos 38 - 126 U/L - 57 109  AST 15 - 41 U/L - 30 18  ALT 0 - 44 U/L - 22 14      Microbiology: Recent Results (from the past 240 hour(s))  Blood culture (routine single)     Status: None (Preliminary result)   Collection Time: 07/01/2021  6:12 AM   Specimen: BLOOD  Result Value Ref Range Status   Specimen Description BLOOD  LAC  Final   Special Requests   Final    BOTTLES DRAWN AEROBIC AND ANAEROBIC Blood Culture adequate volume   Culture  Setup Time   Final    GRAM POSITIVE COCCI Organism ID to follow CRITICAL RESULT CALLED TO, READ BACK BY AND VERIFIED WITH: JASON ROBBINS PHARMD 2139 06/22/2021 HNM IN BOTH AEROBIC AND ANAEROBIC BOTTLES Performed at [facility], [address] [place], [place] [address]    Culture [facility] POSITIVE COCCI  Final   Report Status PENDING  Incomplete  Blood Culture ID Panel (Reflexed)     Status: Abnormal   Collection Time: 06/24/2021  6:12 AM  Result Value Ref Range Status   Enterococcus faecalis DETECTED (A) NOT DETECTED Final    Comment: CRITICAL RESULT CALLED TO, READ BACK BY AND VERIFIED WITH: JASON ROBBINS PHARMD 2139 06/24/2021 HNM    Enterococcus Faecium NOT DETECTED NOT DETECTED Final   Listeria monocytogenes NOT DETECTED NOT  DETECTED Final   Staphylococcus species DETECTED (A) NOT DETECTED Final    Comment: CRITICAL RESULT CALLED TO, READ BACK BY AND VERIFIED WITH: JASON ROBBINS PHARMD 2139 06/25/2021 HNM    Staphylococcus aureus (BCID) NOT DETECTED NOT DETECTED Final   Staphylococcus epidermidis DETECTED (A) NOT DETECTED Final    Comment: Methicillin (oxacillin) resistant coagulase negative staphylococcus. Possible blood culture contaminant (unless isolated from more than one blood culture draw or clinical case suggests pathogenicity). No antibiotic treatment is indicated for blood  culture contaminants. CRITICAL RESULT CALLED TO, READ BACK BY AND VERIFIED WITH: JASON ROBBINS PHARMD 2139 06/30/2021 HNM    Staphylococcus lugdunensis NOT DETECTED NOT DETECTED Final   Streptococcus species NOT DETECTED NOT DETECTED Final   Streptococcus agalactiae NOT DETECTED NOT DETECTED Final   Streptococcus pneumoniae NOT DETECTED NOT DETECTED Final   Streptococcus pyogenes NOT DETECTED NOT DETECTED Final   A.calcoaceticus-baumannii NOT DETECTED NOT DETECTED Final   Bacteroides fragilis NOT DETECTED NOT DETECTED Final   Enterobacterales NOT DETECTED NOT DETECTED Final   Enterobacter cloacae complex NOT DETECTED NOT DETECTED Final   Escherichia coli NOT DETECTED NOT DETECTED Final   Klebsiella aerogenes NOT DETECTED NOT DETECTED Final   Klebsiella oxytoca NOT DETECTED NOT DETECTED Final   Klebsiella pneumoniae NOT DETECTED NOT DETECTED Final   Proteus species NOT DETECTED NOT DETECTED Final   Salmonella species NOT DETECTED NOT DETECTED Final   Serratia marcescens NOT DETECTED NOT DETECTED Final   Haemophilus influenzae NOT DETECTED NOT DETECTED Final   Neisseria meningitidis NOT DETECTED NOT DETECTED Final   Pseudomonas aeruginosa NOT DETECTED NOT DETECTED Final   Stenotrophomonas maltophilia NOT DETECTED NOT DETECTED Final   Candida albicans NOT DETECTED NOT DETECTED Final   Candida auris NOT DETECTED NOT DETECTED Final    Candida glabrata NOT DETECTED NOT DETECTED Final   Candida krusei NOT DETECTED NOT DETECTED Final   Candida parapsilosis NOT DETECTED NOT DETECTED Final   Candida tropicalis NOT DETECTED NOT DETECTED Final   Cryptococcus neoformans/gattii NOT DETECTED NOT DETECTED Final   Methicillin resistance mecA/C DETECTED (A) NOT DETECTED Final    Comment: CRITICAL RESULT CALLED TO, READ BACK BY AND VERIFIED WITH: JASON ROBBINS PHARMD 2139 07/11/2021 HNM    Vancomycin resistance NOT DETECTED NOT DETECTED Final    Comment: Performed at [facility], [address] [place], [place] [address]  Resp Panel by RT-PCR (Flu A&B, Covid) Nasopharyngeal Swab     Status: None   Collection Time: 07/05/2021  6:16 AM   Specimen: Nasopharyngeal Swab; Nasopharyngeal(NP) swabs in vial transport medium  Result Value Ref Range Status   SARS Coronavirus 2 by RT PCR NEGATIVE NEGATIVE Final    Comment: (NOTE)  SARS-CoV-2 target nucleic acids are NOT DETECTED.  The SARS-CoV-2 RNA is generally detectable in upper respiratory specimens during the acute phase of infection. The lowest concentration of SARS-CoV-2 viral copies this assay can detect is 138 copies/mL. A negative result does not preclude SARS-Cov-2 infection and should not be used as the sole basis for treatment or other patient management decisions. A negative result may occur with  improper specimen collection/handling, submission of specimen other than nasopharyngeal swab, presence of viral mutation(s) within the areas targeted by this assay, and inadequate number of viral copies(<138 copies/mL). A negative result must be combined with clinical observations, patient history, and epidemiological information. The expected result is Negative.  Fact Sheet for Patients:  EntrepreneurPulse.com.au  Fact Sheet for Healthcare Providers:  IncredibleEmployment.be  This test is no t yet approved or cleared by the [place] FDA and  has been authorized for detection and/or diagnosis of SARS-CoV-2 by FDA under an Emergency Use Authorization (EUA). This EUA will remain  in effect (meaning this test can be used) for the duration of the COVID-19 declaration under Section 564(b)(1) of the Act, 21 U.S.C.section 360bbb-3(b)(1), unless the authorization is terminated  or revoked sooner.       Influenza A by PCR NEGATIVE NEGATIVE Final   Influenza B by PCR NEGATIVE NEGATIVE Final    Comment: (NOTE) The Xpert Xpress SARS-CoV-2/FLU/RSV plus assay is intended as an aid in the diagnosis of influenza from Nasopharyngeal swab specimens and should not be used as a sole basis for treatment. Nasal washings and aspirates are unacceptable for Xpert Xpress SARS-CoV-2/FLU/RSV testing.  Fact Sheet for Patients: EntrepreneurPulse.com.au  Fact Sheet for Healthcare Providers: IncredibleEmployment.be  This test is not yet approved or cleared by the [place] FDA and has been authorized for detection and/or diagnosis of SARS-CoV-2 by FDA under an Emergency Use Authorization (EUA). This EUA will remain in effect (meaning this test can be used) for the duration of the COVID-19 declaration under Section 564(b)(1) of the Act, 21 U.S.C. section 360bbb-3(b)(1), unless the authorization is terminated or revoked.  Performed at [facility], [address] [place], [place] [address]     IMAGING RESULTS:  I have personally reviewed the films ?Indistinct bilateral airspace disease greatest at the bases. No Kerley lines or pleural fluid. Chronic cardiomegaly. Prior median sternotomy and single chamber pacer implant from the left.     LBBB/paced rhythm ? ?Impression/Recommendation Enterococcus bacteremia- in 1 set Patient has pacemaker and concern for endocarditis' No urosepsis Currently on vanco Repeat blood culture and may change to ampicillin Beed 2 d  echo  Staph epidermidis in blood culture likely contaminant  Altered mental status , falls ,in  a patient with underlying dementia  Afib- rate controlled  Anemia CKD  CAD s/p CABG  ___________________________________________________ Discussed the management with his nurse  Note:  This document was prepared using Dragon voice recognition software and may include unintentional dictation errors.

## 2021-07-13 NOTE — ED Notes (Signed)
Pt moved to private room, noted to be urine soaked in moderate resp distress. Pt placed in position to support optimal resp drive.   Pt noted to have accessory muscle use, crackles throughout all lung fields   Medications administered after orders placed. Pt bed linens changed, appropriate pericare administered. Placed on primofit.

## 2021-07-14 DIAGNOSIS — I482 Chronic atrial fibrillation, unspecified: Secondary | ICD-10-CM | POA: Diagnosis not present

## 2021-07-14 DIAGNOSIS — J189 Pneumonia, unspecified organism: Secondary | ICD-10-CM | POA: Diagnosis not present

## 2021-07-14 DIAGNOSIS — G9341 Metabolic encephalopathy: Secondary | ICD-10-CM | POA: Diagnosis not present

## 2021-07-14 DIAGNOSIS — F0391 Unspecified dementia with behavioral disturbance: Secondary | ICD-10-CM | POA: Diagnosis not present

## 2021-07-14 DIAGNOSIS — B952 Enterococcus as the cause of diseases classified elsewhere: Secondary | ICD-10-CM | POA: Diagnosis not present

## 2021-07-14 DIAGNOSIS — N1831 Chronic kidney disease, stage 3a: Secondary | ICD-10-CM | POA: Diagnosis not present

## 2021-07-14 DIAGNOSIS — R296 Repeated falls: Secondary | ICD-10-CM | POA: Diagnosis not present

## 2021-07-14 DIAGNOSIS — R778 Other specified abnormalities of plasma proteins: Secondary | ICD-10-CM | POA: Diagnosis not present

## 2021-07-14 DIAGNOSIS — R7881 Bacteremia: Secondary | ICD-10-CM | POA: Diagnosis not present

## 2021-07-14 DIAGNOSIS — J690 Pneumonitis due to inhalation of food and vomit: Principal | ICD-10-CM

## 2021-07-14 LAB — GASTROINTESTINAL PANEL BY PCR, STOOL (REPLACES STOOL CULTURE)

## 2021-07-14 LAB — LEGIONELLA PNEUMOPHILA SEROGP 1 UR AG: L. pneumophila Serogp 1 Ur Ag: NEGATIVE

## 2021-07-14 LAB — C DIFFICILE QUICK SCREEN W PCR REFLEX
C Diff antigen: NEGATIVE
C Diff interpretation: NOT DETECTED
C Diff toxin: NEGATIVE

## 2021-07-14 LAB — CREATININE, SERUM
Creatinine, Ser: 1.47 mg/dL — ABNORMAL HIGH (ref 0.61–1.24)
GFR, Estimated: 45 mL/min — ABNORMAL LOW (ref >60)

## 2021-07-14 LAB — PROCALCITONIN: Procalcitonin: 0.27 ng/mL

## 2021-07-14 MED ORDER — IPRATROPIUM-ALBUTEROL 0.5-2.5 (3) MG/3ML IN SOLN
3.0000 mL | Freq: Three times a day (TID) | RESPIRATORY_TRACT | Status: DC
  Administered 2021-07-14 – 2021-07-15 (×5): 3 mL via RESPIRATORY_TRACT
  Filled 2021-07-14 (×6): qty 3

## 2021-07-14 MED ORDER — SODIUM CHLORIDE 0.9 % IV SOLN
3.0000 g | Freq: Four times a day (QID) | INTRAVENOUS | Status: DC
  Administered 2021-07-14 – 2021-07-16 (×6): 3 g via INTRAVENOUS
  Filled 2021-07-14: qty 3
  Filled 2021-07-14 (×7): qty 8

## 2021-07-14 NOTE — Progress Notes (Addendum)
Progress Note    Steve Lloyd  NAT:[id number] DOB: 02/07/1930  DOA: 06/28/2021 PCP: Everardo Pacific, MD      Brief Narrative:    Medical records reviewed and are as summarized below:  Steve Lloyd is a 87 y.o. male with medical history significant of hypertension, hyperlipidemia, diabetes mellitus, pacemaker placement, CAD, CABG, atrial fibrillation on Eliquis, kidney stone, dementia, CKD stage IIIa, who presented to the hospital with fall, cough and change in mental status/confusion.  He did not have any chest pain or shortness of breath but his troponins were elevated (maximum troponin level was 764).  He was admitted to the hospital for bilateral pneumonia.  He was treated with empiric IV antibiotics.  Blood culture revealed Enterococcus faecalis and Staph epidermidis.  ID was consulted to assist with management.  He was seen in consultation by the cardiologist for elevated troponins.  2D echo revealed EF estimated at 25 to 30%, severely dilated right and left atria, moderate to severe tricuspid regurgitation and LV diastolic parameters were indeterminate.  Per cardiologist, he is not a good candidate ischemia evaluation/invasive work-up because of severe dementia.    Assessment/Plan:   Principal Problem:   CAP (community acquired pneumonia) Active Problems:   CAD s/p CABG [place] to LAD 1998   Hypercholesterolemia   Essential hypertension   Type II diabetes mellitus with renal manifestations (HCC)   Atrial fibrillation, chronic (HCC)   Dementia (HCC)   CKD (chronic kidney disease), stage IIIa   Hypokalemia   Elevated troponin   Fall at home, initial encounter   Acute metabolic encephalopathy   Normocytic anemia   Body mass index is 23.13 kg/m.   Bilateral pneumonia, Enterococcus faecalis and Staph epidermidis bacteremia: Continue vancomycin and oral azithromycin.  No evidence of vegetation on 2D echo.  Follow-up with ID for further  recommendations.  Acute hypoxemic respiratory failure: He is requiring 4 L/min oxygen via nasal cannula.  Taper off oxygen as able.  Elevated troponins, CAD s/p CABG: Maximum troponin was 764.  He is not a candidate for invasive cardiac work-up because of severe dementia.    Cardiomyopathy/chronic systolic heart CHF: 2D echo showed EF estimated at 25 to 30%.  Continue Toprol-XL and Lasix  Permanent atrial fibrillation, S/p permanent pacemaker: Eliquis on hold  Hyperkalemia: Improved  Acute metabolic encephalopathy with underlying severe dementia with behavioral disturbance: Continue supportive care  S/p fall at home, debility: PT recommends discharge to SNF.  Follow-up with social worker to assist with disposition.  Other comorbidities include type II DM, hypertension, CKD stage IIIa, chronic anemia/iron deficiency anemia  Consult palliative care given multiple comorbidities and guarded prognosis   Diet Order             Diet Heart Room service appropriate? Yes; Fluid consistency: Thin  Diet effective now                      Consultants: Cardiologist Infectious disease  Procedures: None    Medications:    atorvastatin  10 mg Oral Daily   benazepril  5 mg Oral Daily   furosemide  20 mg Intravenous Daily   ipratropium-albuterol  3 mL Nebulization TID   LORazepam  0.5 mg Intravenous Once   metoprolol succinate  12.5 mg Oral Daily   rivastigmine  4.6 mg Transdermal Daily   sertraline  50 mg Oral Daily   Continuous Infusions:  azithromycin 500 mg (07/14/21 0727)   vancomycin  Anti-infectives (From admission, onward)    Start     Dose/Rate Route Frequency Ordered Stop   07/14/21 2200  vancomycin (VANCOREADY) IVPB 1750 mg/350 mL        1,750 mg 175 mL/hr over 120 Minutes Intravenous Every 48 hours 07/04/2021 2259     07/14/2021 2200  vancomycin (VANCOREADY) IVPB 1500 mg/300 mL        1,500 mg 150 mL/hr over 120 Minutes Intravenous  Once 06/28/2021 2153  07/13/21 0024   07/01/2021 0745  cefTRIAXone (ROCEPHIN) 2 g in sodium chloride 0.9 % 100 mL IVPB  Status:  Discontinued        2 g 200 mL/hr over 30 Minutes Intravenous Every 24 hours 06/25/2021 0743 06/30/2021 2154   06/26/2021 0745  azithromycin (ZITHROMAX) 500 mg in sodium chloride 0.9 % 250 mL IVPB        500 mg 250 mL/hr over 60 Minutes Intravenous Every 24 hours 07/07/2021 0743 07/17/21 0744              Family Communication/Anticipated D/C date and plan/Code Status   DVT prophylaxis: Place and maintain sequential compression device Start: 07/11/2021 1117     Code Status: DNR  Family Communication: Judson Roch , daughter Disposition Plan:    Status is: Inpatient  Remains inpatient appropriate because:IV treatments appropriate due to intensity of illness or inability to take PO and Inpatient level of care appropriate due to severity of illness  Dispo: The patient is from: Home              Anticipated d/c is to: SNF              Patient currently is not medically stable to d/c.   Difficult to place patient No           Subjective:   Interval events noted.  Nursing staff reported that he had been confused overnight.  His nurse was at the bedside.  He has a Actuary at the bedside.  Objective:    Vitals:   07/13/21 1630 07/13/21 1825 07/13/21 2006 07/14/21 0005  BP: 118/66 (!) 103/56  (!) 107/58  Pulse:  85  70  Resp: (!) 24 16  (!) 24  Temp:  (!) 97 F (36.1 C)  97.7 F (36.5 C)  TempSrc:      SpO2:  93% 95% 92%  Weight:      Height:       No data found.   Intake/Output Summary (Last 24 hours) at 07/14/2021 1248 Last data filed at 07/13/2021 1608 Gross per 24 hour  Intake --  Output 400 ml  Net -400 ml   Filed Weights   07/11/2021 0609  Weight: 67 kg    Exam:  GEN: NAD SKIN: Erythematous macular rash on the back EYES: No pallor or icterus ENT: MMM CV: RRR PULM: Bibasilar rales, no wheezing ABD: soft, ND, NT, +BS CNS: Drowsy but arousable, non  focal EXT: No edema or tenderness        Data Reviewed:   I have personally reviewed following labs and imaging studies:  Labs: Labs show the following:   Basic Metabolic Panel: Recent Labs  Lab 06/27/2021 0616 06/30/2021 0832 07/13/21 0616 07/14/21 0615  NA 135  --  137  --   K 3.4*  --  3.7  --   CL 101  --  103  --   CO2 21*  --  22  --   GLUCOSE 139*  --  155*  --  BUN 31*  --  30*  --   CREATININE 1.26*  --  1.35* 1.47*  CALCIUM 8.5*  --  8.7*  --   MG  --  2.0  --   --    GFR Estimated Creatinine Clearance: 31.9 mL/min (A) (by C-G formula based on SCr of 1.47 mg/dL (H)). Liver Function Tests: Recent Labs  Lab 07/02/2021 0616  AST 30  ALT 22  ALKPHOS 57  BILITOT 1.2  PROT 6.1*  ALBUMIN 2.9*   Recent Labs  Lab 06/29/2021 0616  LIPASE 38   No results for input(s): AMMONIA in the last 168 hours. Coagulation profile Recent Labs  Lab 06/26/2021 0616  INR 1.7*    CBC: Recent Labs  Lab 07/07/2021 0616 06/17/2021 0631 07/13/21 0616  WBC 8.5  --  12.1*  NEUTROABS 6.5  --   --   HGB 9.1* 8.8* 9.3*  HCT 26.8* 26.4* 28.6*  MCV 94.7  --  95.7  PLT 176  --  198   Cardiac Enzymes: No results for input(s): CKTOTAL, CKMB, CKMBINDEX, TROPONINI in the last 168 hours. BNP (last 3 results) No results for input(s): PROBNP in the last 8760 hours. CBG: Recent Labs  Lab 07/13/21 0822  GLUCAP 135*   D-Dimer: No results for input(s): DDIMER in the last 72 hours. Hgb A1c: Recent Labs    06/27/2021 0832  HGBA1C 6.1*   Lipid Profile: Recent Labs    07/13/21 0616  CHOL 91  HDL 36*  LDLCALC 40  TRIG 74  CHOLHDL 2.5   Thyroid function studies: No results for input(s): TSH, T4TOTAL, T3FREE, THYROIDAB in the last 72 hours.  Invalid input(s): FREET3 Anemia work up: Recent Labs    07/13/21 0616  VITAMINB12 528  FOLATE 19.7  FERRITIN 70  TIBC 270  IRON 17*  RETICCTPCT 3.1   Sepsis Labs: Recent Labs  Lab 06/17/2021 0612 07/05/2021 0616 07/14/2021 0837  07/13/21 0616  PROCALCITON  --  0.11  --   --   WBC  --  8.5  --  12.1*  LATICACIDVEN 1.8  --  0.9  --     Microbiology Recent Results (from the past 240 hour(s))  Blood culture (routine single)     Status: Abnormal (Preliminary result)   Collection Time: 06/29/2021  6:12 AM   Specimen: BLOOD  Result Value Ref Range Status   Specimen Description   Final    BLOOD  LAC Performed at [facility], [address], [place], [place] [address]    Special Requests   Final    BOTTLES DRAWN AEROBIC AND ANAEROBIC Blood Culture adequate volume Performed at [facility], [address], [place], [place] [address]    Culture  Setup Time   Final    GRAM POSITIVE COCCI Organism ID to follow CRITICAL RESULT CALLED TO, READ BACK BY AND VERIFIED WITH: Violeta Gelinas [facility] 2139 07/06/2021 HNM IN BOTH AEROBIC AND ANAEROBIC BOTTLES Performed at [facility], [address], [place], [place] [address]    Culture (A)  Final    ENTEROCOCCUS FAECALIS STAPHYLOCOCCUS EPIDERMIDIS STAPHYLOCOCCUS HAEMOLYTICUS    Report Status PENDING  Incomplete  Blood Culture ID Panel (Reflexed)     Status: Abnormal   Collection Time: 06/30/2021  6:12 AM  Result Value Ref Range Status   Enterococcus faecalis DETECTED (A) NOT DETECTED Final    Comment: CRITICAL RESULT CALLED TO, READ BACK BY AND VERIFIED WITH: JASON ROBBINS PHARMD 2139 06/20/2021 HNM    Enterococcus  Faecium NOT DETECTED NOT DETECTED Final   Listeria monocytogenes NOT DETECTED NOT DETECTED Final   Staphylococcus species DETECTED (A) NOT DETECTED Final    Comment: CRITICAL RESULT CALLED TO, READ BACK BY AND VERIFIED WITH: JASON ROBBINS PHARMD 2139 07/02/2021 HNM    Staphylococcus aureus (BCID) NOT DETECTED NOT DETECTED Final   Staphylococcus epidermidis DETECTED (A) NOT DETECTED Final    Comment: Methicillin (oxacillin) resistant coagulase negative staphylococcus. Possible blood culture contaminant (unless isolated from more than one  blood culture draw or clinical case suggests pathogenicity). No antibiotic treatment is indicated for blood  culture contaminants. CRITICAL RESULT CALLED TO, READ BACK BY AND VERIFIED WITH: JASON ROBBINS PHARMD 2139 07/01/2021 HNM    Staphylococcus lugdunensis NOT DETECTED NOT DETECTED Final   Streptococcus species NOT DETECTED NOT DETECTED Final   Streptococcus agalactiae NOT DETECTED NOT DETECTED Final   Streptococcus pneumoniae NOT DETECTED NOT DETECTED Final   Streptococcus pyogenes NOT DETECTED NOT DETECTED Final   A.calcoaceticus-baumannii NOT DETECTED NOT DETECTED Final   Bacteroides fragilis NOT DETECTED NOT DETECTED Final   Enterobacterales NOT DETECTED NOT DETECTED Final   Enterobacter cloacae complex NOT DETECTED NOT DETECTED Final   Escherichia coli NOT DETECTED NOT DETECTED Final   Klebsiella aerogenes NOT DETECTED NOT DETECTED Final   Klebsiella oxytoca NOT DETECTED NOT DETECTED Final   Klebsiella pneumoniae NOT DETECTED NOT DETECTED Final   Proteus species NOT DETECTED NOT DETECTED Final   Salmonella species NOT DETECTED NOT DETECTED Final   Serratia marcescens NOT DETECTED NOT DETECTED Final   Haemophilus influenzae NOT DETECTED NOT DETECTED Final   Neisseria meningitidis NOT DETECTED NOT DETECTED Final   Pseudomonas aeruginosa NOT DETECTED NOT DETECTED Final   Stenotrophomonas maltophilia NOT DETECTED NOT DETECTED Final   Candida albicans NOT DETECTED NOT DETECTED Final   Candida auris NOT DETECTED NOT DETECTED Final   Candida glabrata NOT DETECTED NOT DETECTED Final   Candida krusei NOT DETECTED NOT DETECTED Final   Candida parapsilosis NOT DETECTED NOT DETECTED Final   Candida tropicalis NOT DETECTED NOT DETECTED Final   Cryptococcus neoformans/gattii NOT DETECTED NOT DETECTED Final   Methicillin resistance mecA/C DETECTED (A) NOT DETECTED Final    Comment: CRITICAL RESULT CALLED TO, READ BACK BY AND VERIFIED WITH: JASON ROBBINS PHARMD 2139 07/04/2021 HNM     Vancomycin resistance NOT DETECTED NOT DETECTED Final    Comment: Performed at [facility], [address] [place], [place] [address]  Resp Panel by RT-PCR (Flu A&B, Covid) Nasopharyngeal Swab     Status: None   Collection Time: 07/11/2021  6:16 AM   Specimen: Nasopharyngeal Swab; Nasopharyngeal(NP) swabs in vial transport medium  Result Value Ref Range Status   SARS Coronavirus 2 by RT PCR NEGATIVE NEGATIVE Final    Comment: (NOTE) SARS-CoV-2 target nucleic acids are NOT DETECTED.  The SARS-CoV-2 RNA is generally detectable in upper respiratory specimens during the acute phase of infection. The lowest concentration of SARS-CoV-2 viral copies this assay can detect is 138 copies/mL. A negative result does not preclude SARS-Cov-2 infection and should not be used as the sole basis for treatment or other patient management decisions. A negative result may occur with  improper specimen collection/handling, submission of specimen other than nasopharyngeal swab, presence of viral mutation(s) within the areas targeted by this assay, and inadequate number of viral copies(<138 copies/mL). A negative result must be combined with clinical observations, patient history, and epidemiological information. The expected result is Negative.  Fact Sheet for Patients:  EntrepreneurPulse.com.au  Fact Sheet for Healthcare Providers:  IncredibleEmployment.be  This test is no t yet approved or cleared by the [place] FDA and  has been authorized for detection and/or diagnosis of SARS-CoV-2 by FDA under an Emergency Use Authorization (EUA). This EUA will remain  in effect (meaning this test can be used) for the duration of the COVID-19 declaration under Section 564(b)(1) of the Act, 21 U.S.C.section 360bbb-3(b)(1), unless the authorization is terminated  or revoked sooner.       Influenza A by PCR NEGATIVE NEGATIVE Final   Influenza B by PCR NEGATIVE  NEGATIVE Final    Comment: (NOTE) The Xpert Xpress SARS-CoV-2/FLU/RSV plus assay is intended as an aid in the diagnosis of influenza from Nasopharyngeal swab specimens and should not be used as a sole basis for treatment. Nasal washings and aspirates are unacceptable for Xpert Xpress SARS-CoV-2/FLU/RSV testing.  Fact Sheet for Patients: EntrepreneurPulse.com.au  Fact Sheet for Healthcare Providers: IncredibleEmployment.be  This test is not yet approved or cleared by the [place] FDA and has been authorized for detection and/or diagnosis of SARS-CoV-2 by FDA under an Emergency Use Authorization (EUA). This EUA will remain in effect (meaning this test can be used) for the duration of the COVID-19 declaration under Section 564(b)(1) of the Act, 21 U.S.C. section 360bbb-3(b)(1), unless the authorization is terminated or revoked.  Performed at [facility], [address], [place], [place] [address]   Urine Culture     Status: None   Collection Time: 06/18/2021 12:00 PM   Specimen: Urine, Random  Result Value Ref Range Status   Specimen Description   Final    URINE, RANDOM Performed at [facility], [address] [place], [place] [address]    Special Requests   Final    NONE Performed at [facility], [address], [place], [place] [address]    Culture   Final    NO GROWTH Performed at [facility], [place] [address] [place], [place] [address]    Report Status 07/13/2021 FINAL  Final  CULTURE, BLOOD (ROUTINE X 2) w Reflex to ID Panel     Status: None (Preliminary result)   Collection Time: 07/13/21 12:27 PM   Specimen: BLOOD  Result Value Ref Range Status   Specimen Description BLOOD LEFT ANTECUBITAL  Final   Special Requests   Final    BOTTLES DRAWN AEROBIC AND ANAEROBIC Blood Culture adequate volume   Culture   Final    NO GROWTH < 24 HOURS Performed at [facility], [address] [place], [place] [address]    Report Status PENDING  Incomplete  CULTURE, BLOOD (ROUTINE X 2) w Reflex to ID Panel     Status: None (Preliminary result)   Collection Time: 07/13/21 12:30 PM   Specimen: BLOOD  Result Value Ref Range Status   Specimen Description BLOOD BLOOD LEFT HAND  Final   Special Requests   Final    BOTTLES DRAWN AEROBIC AND ANAEROBIC Blood Culture adequate volume   Culture   Final    NO GROWTH < 24 HOURS Performed at [facility], [address], [place], [place] [address]    Report Status PENDING  Incomplete    Procedures and diagnostic studies:  DG Chest Port 1 View  Result Date: 07/13/2021 CLINICAL DATA:  87 year old male with history of dyspnea. EXAM: PORTABLE CHEST 1 VIEW COMPARISON:  Chest x-ray 07/08/2021. FINDINGS: Lung volumes are low. Diffuse peribronchial cuffing, widespread interstitial prominence and patchy  multifocal ill-defined airspace disease is noted in the lungs bilaterally, most severe in the right lung, where there is significantly worsened airspace consolidation. No definite pleural effusions. No pneumothorax. Cardiomegaly with cephalization of the pulmonary vasculature. Upper mediastinal contours are within normal limits. Atherosclerotic calcifications in the thoracic aorta. Left-sided pacemaker device in place with lead tips projecting over the expected location of the right ventricular apex. Status post median sternotomy. IMPRESSION: 1. Worsening bilateral bronchopneumonia (right greater than left), as above. 2. Cardiomegaly with pulmonary venous congestion. 3. Aortic atherosclerosis. Electronically Signed   By: Vinnie Langton M.D.   On: 07/13/2021 07:48   ECHOCARDIOGRAM COMPLETE  Result Date: 07/13/2021    ECHOCARDIOGRAM REPORT   Patient Name:   Steve Lloyd Date of Exam: 06/16/2021 Medical Rec #:  [id number]        Height:       67.0 in Accession #:    [id number]       Weight:       147.7 lb Date of Birth:  05/10/1930         BSA:           1.778 m Patient Age:    83 years         BP:           125/77 mmHg Patient Gender: M                HR:           66 bpm. Exam Location:  [facility] Procedure: 2D Echo, Cardiac Doppler and Color Doppler Indications:     D40.81 Acute Diastolic CHF  History:         Patient has prior history of Echocardiogram examinations, most                  recent 06/16/2016. CAD, Pacemaker, Arrythmias:Atrial                  Fibrillation; Risk Factors:Diabetes.  Sonographer:     Cresenciano Lick RDCS Referring Phys:  [place] Diagnosing Phys: Kate Sable MD IMPRESSIONS  1. Left ventricular ejection fraction, by estimation, is 25 to 30%. The left ventricle has severely decreased function. The left ventricle demonstrates global hypokinesis. Left ventricular diastolic parameters are indeterminate.  2. Right ventricular systolic function is mildly reduced. The right ventricular size is normal. There is severely elevated pulmonary artery systolic pressure. The estimated right ventricular systolic pressure is 44.8 mmHg.  3. Left atrial size was severely dilated.  4. Right atrial size was severely dilated.  5. The mitral valve is normal in structure. Moderate mitral valve regurgitation.  6. Tricuspid valve regurgitation is moderate to severe.  7. The aortic valve is tricuspid. Aortic valve regurgitation is mild. Mild to moderate aortic valve sclerosis/calcification is present, without any evidence of aortic stenosis.  8. The inferior vena cava is dilated in size with <50% respiratory variability, suggesting right atrial pressure of 15 mmHg. FINDINGS  Left Ventricle: Left ventricular ejection fraction, by estimation, is 25 to 30%. The left ventricle has severely decreased function. The left ventricle demonstrates global hypokinesis. The left ventricular internal cavity size was normal in size. There is no left ventricular hypertrophy. Left ventricular diastolic parameters are indeterminate. Right Ventricle: The right  ventricular size is normal. No increase in right ventricular wall thickness. Right ventricular systolic function is mildly reduced. There is severely elevated pulmonary artery systolic pressure. The tricuspid regurgitant velocity is 3.63 m/s, and with an assumed right  atrial pressure of 15 mmHg, the estimated right ventricular systolic pressure is 25.9 mmHg. Left Atrium: Left atrial size was severely dilated. Right Atrium: Right atrial size was severely dilated. Pericardium: There is no evidence of pericardial effusion. Mitral Valve: The mitral valve is normal in structure. Moderate mitral valve regurgitation. Tricuspid Valve: The tricuspid valve is normal in structure. Tricuspid valve regurgitation is moderate to severe. Aortic Valve: The aortic valve is tricuspid. Aortic valve regurgitation is mild. Mild to moderate aortic valve sclerosis/calcification is present, without any evidence of aortic stenosis. Pulmonic Valve: The pulmonic valve was not well visualized. Pulmonic valve regurgitation is not visualized. Aorta: The aortic root is normal in size and structure. Venous: The inferior vena cava is dilated in size with less than 50% respiratory variability, suggesting right atrial pressure of 15 mmHg. IAS/Shunts: No atrial level shunt detected by color flow Doppler. Additional Comments: A device lead is visualized.  LEFT VENTRICLE PLAX 2D LVIDd:         5.50 cm LVIDs:         4.20 cm LV PW:         1.10 cm LV IVS:        0.80 cm LVOT diam:     1.80 cm LV SV:         32 LV SV Index:   18 LVOT Area:     2.54 cm  RIGHT VENTRICLE             IVC RV Basal diam:  5.00 cm     IVC diam: 2.60 cm RV S prime:     15.05 cm/s TAPSE (M-mode): 1.5 cm LEFT ATRIUM              Index       RIGHT ATRIUM           Index LA diam:        5.60 cm  3.15 cm/m  RA Area:     32.10 cm LA Vol (A2C):   101.0 ml 56.81 ml/m RA Volume:   118.00 ml 66.37 ml/m LA Vol (A4C):   89.1 ml  50.12 ml/m LA Biplane Vol: 94.8 ml  53.32 ml/m  AORTIC  VALVE LVOT Vmax:   68.75 cm/s LVOT Vmean:  50.900 cm/s LVOT VTI:    0.128 m  AORTA Ao Root diam: 3.50 cm MV E velocity: 85.17 cm/s  TRICUSPID VALVE                            TR Peak grad:   52.7 mmHg                            TR Vmax:        363.00 cm/s                             SHUNTS                            Systemic VTI:  0.13 m                            Systemic Diam: 1.80 cm Kate Sable MD Electronically signed by Kate Sable MD Signature Date/Time: 07/13/2021/1:06:02 PM    Final  LOS: 2 days   Jkai Arwood  [facility]   Pager on [email or web address]. If 7PM-7AM, please contact night-coverage at [email or web address]     07/14/2021, 12:48 PM

## 2021-07-14 NOTE — Progress Notes (Signed)
Occupational Therapy Treatment Patient Details Name: Steve Lloyd MRN: [id number] DOB: 1930-05-10 Today's Date: 07/14/2021   History of present illness 87 y.o. male with a history as listed below which includes some mild dementia and who presents with acute worsening mental status.  His family reported to paramedics that he had a fall about 4 days ago and was evaluated and medically cleared.  However since that time he has gradually worsened and his altered mental status has become severe tonight.  Family reports that since then he has had multiple falls as and been ambulating around the house urinating and defecating "all over." Pt w/u for bilateral PNA.   OT comments  Pt seen for OT Tx this date to f/u re: safety with ADLs/ADL mobility. Pt mentating somewhat better able this date as evidenced by increased attn to task and increased following of simple one step commands (~75%). Pt requires increased processing time and cues for safety throughout. OT engages pt in sup to sit with MIN A. Pt requires MIN A for STS with RW. Demos F balance. He starts to become anxious and is then noted to start having liquid BM. OT assists pt with MOD A and use of RW to take 4 small steps to [facility] (OT ultimately has to pull commode up behind pt for safety d/t wetness of the floor. Pt requires MAX A for peri care and LB dressing, MOD A for UB bathing and MIN A for dressing. OT grades task by providing visual cues versus verbal in the form of modeling the task so that pt can better sequence. Pt returned to bed with alarm on, sitting in room and all needs met end of session. OT shares with RN and MD re: pt coughing with thin liquids and concern for loose stools. Will continue to follow.   Recommendations for follow up therapy are one component of a multi-disciplinary discharge planning process, led by the attending physician.  Recommendations may be updated based on patient status, additional functional criteria and insurance  authorization.    Follow Up Recommendations  SNF    Equipment Recommendations  3 in 1 bedside commode;Tub/shower seat;Other (comment)    Recommendations for Other Services      Precautions / Restrictions Precautions Precautions: Fall Restrictions Weight Bearing Restrictions: No       Mobility Bed Mobility Overal bed mobility: Needs Assistance Bed Mobility: Supine to Sit;Sit to Supine     Supine to sit: Min assist;HOB elevated Sit to supine: Min assist   General bed mobility comments: increased time, cues for sequence    Transfers Overall transfer level: Needs assistance Equipment used: Rolling walker (2 wheeled) Transfers: Sit to/from Stand Sit to Stand: Min assist         General transfer comment: increased time, cues for safety, requires assist to steady, poor safety awareness/awareness of hazards.    Balance Overall balance assessment: Needs assistance Sitting-balance support: Feet supported Sitting balance-Leahy Scale: Good     Standing balance support: Bilateral upper extremity supported Standing balance-Leahy Scale: Fair Standing balance comment: Requires UE support, initially fair, but as pt fatigues after toileting, balance becomes more poor.                           ADL either performed or assessed with clinical judgement   ADL Overall ADL's : Needs assistance/impaired Eating/Feeding: Minimal assistance;Moderate assistance;Sitting Eating/Feeding Details (indicate cue type and reason): to take sips of tea. He is noted to  cough after every single sip which is brought to MD and RN's attention and SLP order placed. Grooming: Dance movement psychotherapist;Set up;Cueing for sequencing;Sitting   Upper Body Bathing: Moderate assistance;Sitting   Lower Body Bathing: Maximal assistance;Sit to/from stand Lower Body Bathing Details (indicate cue type and reason): esp for posterior LB bathing in standing d/t unstable standing balnace as well as decreased insight  for thorough completion Upper Body Dressing : Minimal assistance;Sitting   Lower Body Dressing: Maximal assistance;Sitting/lateral leans Lower Body Dressing Details (indicate cue type and reason): to don socks Toilet Transfer: Minimal assistance;Moderate assistance;Ambulation;RW;BSC Toilet Transfer Details (indicate cue type and reason): takes ~4-5 small steps but is distracted d/t having loose liquid BM requiring OT to utimately pull BSC up behind him for safety. Toileting- Clothing Manipulation and Hygiene: Maximal assistance;Sit to/from stand       Functional mobility during ADLs: Minimal assistance;Rolling walker;Cueing for safety       Vision Patient Visual Report: No change from baseline     Perception     Praxis      Cognition Arousal/Alertness: Awake/alert Behavior During Therapy: Impulsive Overall Cognitive Status: Difficult to assess                                 General Comments: Pt oriented to self, somewhat better able to follow simple commands today. Still with decreased insight and impulsive, but overall attentive and participates in therapy appropriately. He perseverates on work stating that we are at work right now.        Exercises Other Exercises Other Exercises: OT engages pt in bathing and dressing tasks.   Shoulder Instructions       General Comments      Pertinent Vitals/ Pain       Pain Assessment: No/denies pain  Home Living                                          Prior Functioning/Environment              Frequency  Min 2X/week        Progress Toward Goals  OT Goals(current goals can now be found in the care plan section)  Progress towards OT goals: Progressing toward goals  Acute Rehab OT Goals Patient Stated Goal: daughter hoping pt can go to rehab to get safer and more steady OT Goal Formulation: With family Time For Goal Achievement: 07/27/21 Potential to Achieve Goals: Good  Plan  Discharge plan remains appropriate    Co-evaluation                 AM-PAC OT "6 Clicks" Daily Activity     Outcome Measure   Help from another person eating meals?: A Little Help from another person taking care of personal grooming?: A Little Help from another person toileting, which includes using toliet, bedpan, or urinal?: A Lot Help from another person bathing (including washing, rinsing, drying)?: A Lot Help from another person to put on and taking off regular upper body clothing?: A Little Help from another person to put on and taking off regular lower body clothing?: A Lot 6 Click Score: 15    End of Session Equipment Utilized During Treatment: Gait belt;Rolling walker;Oxygen  OT Visit Diagnosis: Unsteadiness on feet (R26.81);Muscle weakness (generalized) (M62.81);Other symptoms and signs involving cognitive function  Activity Tolerance Patient tolerated treatment well   Patient Left in bed;with call bell/phone within reach;with nursing/sitter in room   Nurse Communication          Time: 1700-1738 OT Time Calculation (min): 38 min  Charges: OT General Charges $OT Visit: 1 Visit OT Treatments $Self Care/Home Management : 23-37 mins $Therapeutic Activity: 8-22 mins  Gerrianne Scale, [place], OTR/L ascom [phone] 07/14/21, 7:01 PM

## 2021-07-14 NOTE — Progress Notes (Signed)
To whom it may concern: The above named patient requires a short term nursing home stay - 30 days or less for strengthening and rehabilitation.  Plan is for return home.

## 2021-07-14 NOTE — Progress Notes (Signed)
Progress Note  Patient Name: Steve Lloyd Date of Encounter: 07/14/2021  [facility] Cardiologist: Sanda Klein, MD   Subjective   Resting comfortably in bed, no complaints, sitter at the bedside Denies any chest pain or shortness of breath No family at the bedside Troponin elevation discussed with him Telemetry with atrial fibrillation, rate well controlled  Inpatient Medications    Scheduled Meds:  atorvastatin  10 mg Oral Daily   benazepril  5 mg Oral Daily   furosemide  20 mg Intravenous Daily   ipratropium-albuterol  3 mL Nebulization TID   LORazepam  0.5 mg Intravenous Once   metoprolol succinate  12.5 mg Oral Daily   rivastigmine  4.6 mg Transdermal Daily   sertraline  50 mg Oral Daily   Continuous Infusions:  azithromycin 500 mg (07/14/21 0727)   vancomycin     PRN Meds: acetaminophen, albuterol, dextromethorphan-guaiFENesin, haloperidol lactate, hydrALAZINE, morphine injection, OLANZapine zydis, ondansetron (ZOFRAN) IV   Vital Signs    Vitals:   07/13/21 1825 07/13/21 2006 07/14/21 0005 07/14/21 1301  BP: (!) 103/56  (!) 107/58 (!) 97/57  Pulse: 85  70 83  Resp: 16  (!) 24 15  Temp: (!) 97 F (36.1 C)  97.7 F (36.5 C) (!) 97.5 F (36.4 C)  TempSrc:    Oral  SpO2: 93% 95% 92% (!) 80%  Weight:      Height:        Intake/Output Summary (Last 24 hours) at 07/14/2021 1459 Last data filed at 07/13/2021 1608 Gross per 24 hour  Intake --  Output 400 ml  Net -400 ml   Last 3 Weights 06/19/2021 04/25/2021 12/15/2019  Weight (lbs) 147 lb 11.3 oz 148 lb 9.6 oz 170 lb  Weight (kg) 67 kg 67.405 kg 77.111 kg      Telemetry    Atrial fibrillation, rate 70-80- Personally Reviewed  ECG    - Personally Reviewed  Physical Exam   GEN: No acute distress.   Neck: No JVD Cardiac: Irregularly irregular, no murmurs, rubs, or gallops.  Respiratory: Clear to auscultation bilaterally. GI: Soft, nontender, non-distended  MS: No edema; No  deformity. Neuro:  Nonfocal  Psych: Normal affect   Labs    High Sensitivity Troponin:   Recent Labs  Lab 07/03/2021 0832 06/23/2021 1630 06/24/2021 1930 07/13/21 1011 07/13/21 1230  TROPONINIHS 101* 731* 764* 673* 759*     Chemistry Recent Labs  Lab 07/01/2021 0616 07/02/2021 0832 07/13/21 0616 07/14/21 0615  NA 135  --  137  --   K 3.4*  --  3.7  --   CL 101  --  103  --   CO2 21*  --  22  --   GLUCOSE 139*  --  155*  --   BUN 31*  --  30*  --   CREATININE 1.26*  --  1.35* 1.47*  CALCIUM 8.5*  --  8.7*  --   MG  --  2.0  --   --   PROT 6.1*  --   --   --   ALBUMIN 2.9*  --   --   --   AST 30  --   --   --   ALT 22  --   --   --   ALKPHOS 57  --   --   --   BILITOT 1.2  --   --   --   GFRNONAA 55*  --  50* 45*  ANIONGAP 13  --  12  --     Lipids  Recent Labs  Lab 07/13/21 0616  CHOL 91  TRIG 74  HDL 36*  LDLCALC 40  CHOLHDL 2.5    Hematology Recent Labs  Lab 07/11/2021 0616 07/08/2021 0631 07/13/21 0616  WBC 8.5  --  12.1*  RBC 2.83*  --  2.99*  3.01*  HGB 9.1* 8.8* 9.3*  HCT 26.8* 26.4* 28.6*  MCV 94.7  --  95.7  MCH 32.2  --  31.1  MCHC 34.0  --  32.5  RDW 15.3  --  15.6*  PLT 176  --  198   Thyroid No results for input(s): TSH, FREET4 in the last 168 hours.  BNP Recent Labs  Lab 06/27/2021 0616  BNP 849.7*    DDimer No results for input(s): DDIMER in the last 168 hours.   Radiology    DG Chest Port 1 View  Result Date: 07/13/2021 CLINICAL DATA:  87 year old male with history of dyspnea. EXAM: PORTABLE CHEST 1 VIEW COMPARISON:  Chest x-ray 07/13/2021. FINDINGS: Lung volumes are low. Diffuse peribronchial cuffing, widespread interstitial prominence and patchy multifocal ill-defined airspace disease is noted in the lungs bilaterally, most severe in the right lung, where there is significantly worsened airspace consolidation. No definite pleural effusions. No pneumothorax. Cardiomegaly with cephalization of the pulmonary vasculature. Upper mediastinal  contours are within normal limits. Atherosclerotic calcifications in the thoracic aorta. Left-sided pacemaker device in place with lead tips projecting over the expected location of the right ventricular apex. Status post median sternotomy. IMPRESSION: 1. Worsening bilateral bronchopneumonia (right greater than left), as above. 2. Cardiomegaly with pulmonary venous congestion. 3. Aortic atherosclerosis. Electronically Signed   By: Vinnie Langton M.D.   On: 07/13/2021 07:48   ECHOCARDIOGRAM COMPLETE  Result Date: 07/13/2021    ECHOCARDIOGRAM REPORT   Patient Name:   Steve Lloyd Date of Exam: 06/16/2021 Medical Rec #:  [id number]        Height:       67.0 in Accession #:    [id number]       Weight:       147.7 lb Date of Birth:  1930-03-25         BSA:          1.778 m Patient Age:    70 years         BP:           125/77 mmHg Patient Gender: M                HR:           66 bpm. Exam Location:  [facility] Procedure: 2D Echo, Cardiac Doppler and Color Doppler Indications:     O75.64 Acute Diastolic CHF  History:         Patient has prior history of Echocardiogram examinations, most                  recent 06/16/2016. CAD, Pacemaker, Arrythmias:Atrial                  Fibrillation; Risk Factors:Diabetes.  Sonographer:     Cresenciano Lick RDCS Referring Phys:  [place] Diagnosing Phys: Kate Sable MD IMPRESSIONS  1. Left ventricular ejection fraction, by estimation, is 25 to 30%. The left ventricle has severely decreased function. The left ventricle demonstrates global hypokinesis. Left ventricular diastolic parameters are indeterminate.  2. Right ventricular systolic function is mildly reduced. The right ventricular size is normal. There is severely  elevated pulmonary artery systolic pressure. The estimated right ventricular systolic pressure is 73.5 mmHg.  3. Left atrial size was severely dilated.  4. Right atrial size was severely dilated.  5. The mitral valve is normal in structure. Moderate  mitral valve regurgitation.  6. Tricuspid valve regurgitation is moderate to severe.  7. The aortic valve is tricuspid. Aortic valve regurgitation is mild. Mild to moderate aortic valve sclerosis/calcification is present, without any evidence of aortic stenosis.  8. The inferior vena cava is dilated in size with <50% respiratory variability, suggesting right atrial pressure of 15 mmHg. FINDINGS  Left Ventricle: Left ventricular ejection fraction, by estimation, is 25 to 30%. The left ventricle has severely decreased function. The left ventricle demonstrates global hypokinesis. The left ventricular internal cavity size was normal in size. There is no left ventricular hypertrophy. Left ventricular diastolic parameters are indeterminate. Right Ventricle: The right ventricular size is normal. No increase in right ventricular wall thickness. Right ventricular systolic function is mildly reduced. There is severely elevated pulmonary artery systolic pressure. The tricuspid regurgitant velocity is 3.63 m/s, and with an assumed right atrial pressure of 15 mmHg, the estimated right ventricular systolic pressure is 32.9 mmHg. Left Atrium: Left atrial size was severely dilated. Right Atrium: Right atrial size was severely dilated. Pericardium: There is no evidence of pericardial effusion. Mitral Valve: The mitral valve is normal in structure. Moderate mitral valve regurgitation. Tricuspid Valve: The tricuspid valve is normal in structure. Tricuspid valve regurgitation is moderate to severe. Aortic Valve: The aortic valve is tricuspid. Aortic valve regurgitation is mild. Mild to moderate aortic valve sclerosis/calcification is present, without any evidence of aortic stenosis. Pulmonic Valve: The pulmonic valve was not well visualized. Pulmonic valve regurgitation is not visualized. Aorta: The aortic root is normal in size and structure. Venous: The inferior vena cava is dilated in size with less than 50% respiratory variability,  suggesting right atrial pressure of 15 mmHg. IAS/Shunts: No atrial level shunt detected by color flow Doppler. Additional Comments: A device lead is visualized.  LEFT VENTRICLE PLAX 2D LVIDd:         5.50 cm LVIDs:         4.20 cm LV PW:         1.10 cm LV IVS:        0.80 cm LVOT diam:     1.80 cm LV SV:         32 LV SV Index:   18 LVOT Area:     2.54 cm  RIGHT VENTRICLE             IVC RV Basal diam:  5.00 cm     IVC diam: 2.60 cm RV S prime:     15.05 cm/s TAPSE (M-mode): 1.5 cm LEFT ATRIUM              Index       RIGHT ATRIUM           Index LA diam:        5.60 cm  3.15 cm/m  RA Area:     32.10 cm LA Vol (A2C):   101.0 ml 56.81 ml/m RA Volume:   118.00 ml 66.37 ml/m LA Vol (A4C):   89.1 ml  50.12 ml/m LA Biplane Vol: 94.8 ml  53.32 ml/m  AORTIC VALVE LVOT Vmax:   68.75 cm/s LVOT Vmean:  50.900 cm/s LVOT VTI:    0.128 m  AORTA Ao Root diam: 3.50 cm MV E velocity: 85.17  cm/s  TRICUSPID VALVE                            TR Peak grad:   52.7 mmHg                            TR Vmax:        363.00 cm/s                             SHUNTS                            Systemic VTI:  0.13 m                            Systemic Diam: 1.80 cm Kate Sable MD Electronically signed by Kate Sable MD Signature Date/Time: 07/13/2021/1:06:02 PM    Final     Cardiac Studies   Echocardiogram July 12, 2021  1. Left ventricular ejection fraction, by estimation, is 25 to 30%. The  left ventricle has severely decreased function. The left ventricle  demonstrates global hypokinesis. Left ventricular diastolic parameters are  indeterminate.   2. Right ventricular systolic function is mildly reduced. The right  ventricular size is normal. There is severely elevated pulmonary artery  systolic pressure. The estimated right ventricular systolic pressure is  52.0 mmHg.   3. Left atrial size was severely dilated.   4. Right atrial size was severely dilated.   5. The mitral valve is normal in structure.  Moderate mitral valve  regurgitation.   6. Tricuspid valve regurgitation is moderate to severe.   7. The aortic valve is tricuspid. Aortic valve regurgitation is mild.  Mild to moderate aortic valve sclerosis/calcification is present, without  any evidence of aortic stenosis.   8. The inferior vena cava is dilated in size with <50% respiratory  variability, suggesting right atrial pressure of 15 mmHg.    Patient Profile     Steve Lloyd is a 87 y.o. male with a hx of CAD/CABG, PPM, dementia who is being seen 07/13/2021 for the evaluation of elevated troponins, ischemic cardiomyopathy  Assessment & Plan    Ischemic cardiomyopathy History of coronary artery disease, CABG, paced rhythm drop in blood pressure from prior echocardiogram 2017 Ejection fraction 25 to 30% with severely elevated right heart pressures -Tolerating low-dose metoprolol, benazepril, On Lasix 20 IV daily, creatinine 1.47 (trend up from 1.35, will continue to monitor) Low blood pressure limiting further titration of his medications  Elevated troponin In the setting of cardiomyopathy, severely elevated right heart pressures Given underlying delirium, dementia, not a good candidate for ischemic work-up Appears relatively asymptomatic  Pulmonary hypertension Right ventricular systolic pressure in the 80E, this appears to be a chronic issue noted September 2017 -Lasix 20 IV daily, consider Lasix 20 oral dosing at discharge  Permanent atrial fibrillation On low-dose beta-blocker, Eliquis Rate relatively well controlled   Total encounter time more than 35 minutes  Greater than 50% was spent in counseling and coordination of care with the patient   For questions or updates, please contact [facility] Please consult [email or web address] for contact info under        Signed, Ida Rogue, MD  07/14/2021, 2:59 PM

## 2021-07-14 NOTE — TOC Progression Note (Signed)
Transition of Care [facility]) - Progression Note    Patient Details  Name: Steve Lloyd MRN: [id number] Date of Birth: 16-Dec-1929  Transition of Care [facility]) CM/SW Contact  Shelbie Hutching, RN Phone Number: 07/14/2021, 3:25 PM  Clinical Narrative:    RNCM met with daughter, Steve Lloyd at the bedside this morning to discuss discharge planning.  Family has questions about hospice and if patient may qualify.  RNCM asked MD for palliative consult for goals of care.  If patient is not a hospice candidate then they are interested in SNF.  Bed search started- family would like to stay in [place] area.   Expected Discharge Plan: [place] Barriers to Discharge: Continued Medical Work up, SNF Pending bed offer  Expected Discharge Plan and Services Expected Discharge Plan: [place] In-house Referral: Clinical Social Work   Post Acute Care Choice: [place] Living arrangements for the past 2 months: Single Family Home                                       Social Determinants of Health (SDOH) Interventions    Readmission Risk Interventions No flowsheet data found.

## 2021-07-14 NOTE — Progress Notes (Signed)
Date of Admission:  06/23/2021     ID: Steve Lloyd is a 87 y.o. male  Principal Problem:   CAP (community acquired pneumonia) Active Problems:   CAD s/p CABG [place] to LAD 1998   Hypercholesterolemia   Essential hypertension   Type II diabetes mellitus with renal manifestations (HCC)   Atrial fibrillation, chronic (HCC)   Dementia (HCC)   CKD (chronic kidney disease), stage IIIa   Hypokalemia   Elevated troponin   Fall at home, initial encounter   Acute metabolic encephalopathy   Normocytic anemia    Subjective: Having diarrhea Also coughing whenever he takes liquid More alert today Working with OT  Medications:   atorvastatin  10 mg Oral Daily   benazepril  5 mg Oral Daily   furosemide  20 mg Intravenous Daily   ipratropium-albuterol  3 mL Nebulization TID   LORazepam  0.5 mg Intravenous Once   metoprolol succinate  12.5 mg Oral Daily   rivastigmine  4.6 mg Transdermal Daily   sertraline  50 mg Oral Daily    Objective: Vital signs in last 24 hours: Temp:  [97 F (36.1 C)-97.7 F (36.5 C)] 97.5 F (36.4 C) (09/29 1301) Pulse Rate:  [70-85] 83 (09/29 1301) Resp:  [15-24] 15 (09/29 1301) BP: (97-107)/(56-58) 97/57 (09/29 1301) SpO2:  [80 %-95 %] 80 % (09/29 1301)  PHYSICAL EXAM:  General: Alert, less confused- emaciated Head: Normocephalic, without obvious abnormality, atraumatic. Eyes: Conjunctivae clear, anicteric sclerae. Pupils are equal ENT Nares normal. No drainage or sinus tenderness. Lips, mucosa, and tongue normal. No Thrush Neck: Supple, symmetrical, no adenopathy, thyroid: non tender no carotid bruit and no JVD. Back: No CVA tenderness. Lungs: b/l air entry- crepts bases Heart: irregular pacemaker Abdomen: Soft, non-tender,not distended. Bowel sounds normal. No masses Extremities: abrasions rt leg  Skin:dry skin Lymph: Cervical, supraclavicular normal. Neurologic: Grossly non-focal  Lab Results Recent Labs    07/08/2021 0616  07/04/2021 0631 07/13/21 0616 07/14/21 0615  WBC 8.5  --  12.1*  --   HGB 9.1* 8.8* 9.3*  --   HCT 26.8* 26.4* 28.6*  --   NA 135  --  137  --   K 3.4*  --  3.7  --   CL 101  --  103  --   CO2 21*  --  22  --   BUN 31*  --  30*  --   CREATININE 1.26*  --  1.35* 1.47*   Liver Panel Recent Labs    06/21/2021 0616  PROT 6.1*  ALBUMIN 2.9*  AST 30  ALT 22  ALKPHOS 77  BILITOT 1.2    Microbiology: [facility] from ED - has enterococcus, staph hemolyticus and staph epi Repeat BC from 07/13/21 pending Studies/Results:   DG Chest Port 1 View  Result Date: 07/13/2021 CLINICAL DATA:  87 year old male with history of dyspnea. EXAM: PORTABLE CHEST 1 VIEW COMPARISON:  Chest x-ray 06/25/2021. FINDINGS: Lung volumes are low. Diffuse peribronchial cuffing, widespread interstitial prominence and patchy multifocal ill-defined airspace disease is noted in the lungs bilaterally, most severe in the right lung, where there is significantly worsened airspace consolidation. No definite pleural effusions. No pneumothorax. Cardiomegaly with cephalization of the pulmonary vasculature. Upper mediastinal contours are within normal limits. Atherosclerotic calcifications in the thoracic aorta. Left-sided pacemaker device in place with lead tips projecting over the expected location of the right ventricular apex. Status post median sternotomy. IMPRESSION: 1. Worsening bilateral bronchopneumonia (right greater than left), as above. 2. Cardiomegaly  with pulmonary venous congestion. 3. Aortic atherosclerosis. Electronically Signed   By: Vinnie Langton M.D.   On: 07/13/2021 07:48   ECHOCARDIOGRAM COMPLETE  Result Date: 07/13/2021    ECHOCARDIOGRAM REPORT   Patient Name:   Steve Lloyd Date of Exam: 07/02/2021 Medical Rec #:  [id number]        Height:       67.0 in Accession #:    [id number]       Weight:       147.7 lb Date of Birth:  06-03-1930         BSA:          1.778 m Patient Age:    52 years         BP:            125/77 mmHg Patient Gender: M                HR:           66 bpm. Exam Location:  [facility] Procedure: 2D Echo, Cardiac Doppler and Color Doppler Indications:     D63.87 Acute Diastolic CHF  History:         Patient has prior history of Echocardiogram examinations, most                  recent 06/16/2016. CAD, Pacemaker, Arrythmias:Atrial                  Fibrillation; Risk Factors:Diabetes.  Sonographer:     Cresenciano Lick RDCS Referring Phys:  [place] Diagnosing Phys: Kate Sable MD IMPRESSIONS  1. Left ventricular ejection fraction, by estimation, is 25 to 30%. The left ventricle has severely decreased function. The left ventricle demonstrates global hypokinesis. Left ventricular diastolic parameters are indeterminate.  2. Right ventricular systolic function is mildly reduced. The right ventricular size is normal. There is severely elevated pulmonary artery systolic pressure. The estimated right ventricular systolic pressure is 56.4 mmHg.  3. Left atrial size was severely dilated.  4. Right atrial size was severely dilated.  5. The mitral valve is normal in structure. Moderate mitral valve regurgitation.  6. Tricuspid valve regurgitation is moderate to severe.  7. The aortic valve is tricuspid. Aortic valve regurgitation is mild. Mild to moderate aortic valve sclerosis/calcification is present, without any evidence of aortic stenosis.  8. The inferior vena cava is dilated in size with <50% respiratory variability, suggesting right atrial pressure of 15 mmHg. FINDINGS  Left Ventricle: Left ventricular ejection fraction, by estimation, is 25 to 30%. The left ventricle has severely decreased function. The left ventricle demonstrates global hypokinesis. The left ventricular internal cavity size was normal in size. There is no left ventricular hypertrophy. Left ventricular diastolic parameters are indeterminate. Right Ventricle: The right ventricular size is normal. No increase in right ventricular wall  thickness. Right ventricular systolic function is mildly reduced. There is severely elevated pulmonary artery systolic pressure. The tricuspid regurgitant velocity is 3.63 m/s, and with an assumed right atrial pressure of 15 mmHg, the estimated right ventricular systolic pressure is 33.2 mmHg. Left Atrium: Left atrial size was severely dilated. Right Atrium: Right atrial size was severely dilated. Pericardium: There is no evidence of pericardial effusion. Mitral Valve: The mitral valve is normal in structure. Moderate mitral valve regurgitation. Tricuspid Valve: The tricuspid valve is normal in structure. Tricuspid valve regurgitation is moderate to severe. Aortic Valve: The aortic valve is tricuspid. Aortic valve regurgitation is mild. Mild to moderate aortic valve  sclerosis/calcification is present, without any evidence of aortic stenosis. Pulmonic Valve: The pulmonic valve was not well visualized. Pulmonic valve regurgitation is not visualized. Aorta: The aortic root is normal in size and structure. Venous: The inferior vena cava is dilated in size with less than 50% respiratory variability, suggesting right atrial pressure of 15 mmHg. IAS/Shunts: No atrial level shunt detected by color flow Doppler. Additional Comments: A device lead is visualized.  LEFT VENTRICLE PLAX 2D LVIDd:         5.50 cm LVIDs:         4.20 cm LV PW:         1.10 cm LV IVS:        0.80 cm LVOT diam:     1.80 cm LV SV:         32 LV SV Index:   18 LVOT Area:     2.54 cm  RIGHT VENTRICLE             IVC RV Basal diam:  5.00 cm     IVC diam: 2.60 cm RV S prime:     15.05 cm/s TAPSE (M-mode): 1.5 cm LEFT ATRIUM              Index       RIGHT ATRIUM           Index LA diam:        5.60 cm  3.15 cm/m  RA Area:     32.10 cm LA Vol (A2C):   101.0 ml 56.81 ml/m RA Volume:   118.00 ml 66.37 ml/m LA Vol (A4C):   89.1 ml  50.12 ml/m LA Biplane Vol: 94.8 ml  53.32 ml/m  AORTIC VALVE LVOT Vmax:   68.75 cm/s LVOT Vmean:  50.900 cm/s LVOT VTI:     0.128 m  AORTA Ao Root diam: 3.50 cm MV E velocity: 85.17 cm/s  TRICUSPID VALVE                            TR Peak grad:   52.7 mmHg                            TR Vmax:        363.00 cm/s                             SHUNTS                            Systemic VTI:  0.13 m                            Systemic Diam: 1.80 cm Kate Sable MD Electronically signed by Kate Sable MD Signature Date/Time: 07/13/2021/1:06:02 PM    Final      Assessment/Plan: Multiple falls  Worsening pneumonia likely aspiration pneumonia  rt side- pt is aspirating liquids- need to get speech involved Will DC vanco and change to unasyn Okay to continue azithromycin for now  Enterococcus bacteremia along with staph epi and staph hemolyticus in blood culture - 1 set drawn in ED. The coag neg staphs are likely contaminants Enterococcus faecalis cannot be deemed a contaminant  Will DC vanco and ampicillin/sulbactam should treat this Pacemaker in place 2 d echo does not show  any vegetations TEE may not be possible due to his poor general condition  Ischemic cardiomyopathy /CHF/ - EF 25-30%  Elevated troponin - followed by cardiology  Diarrhea- present on admission -- having more liquid stools here- will check cdiff and Gi panel  Dementia  Recommend palliative consult to address treatment goals   Discussed the management with his nurse

## 2021-07-15 DIAGNOSIS — I4821 Permanent atrial fibrillation: Secondary | ICD-10-CM

## 2021-07-15 DIAGNOSIS — B952 Enterococcus as the cause of diseases classified elsewhere: Secondary | ICD-10-CM | POA: Diagnosis not present

## 2021-07-15 DIAGNOSIS — G9341 Metabolic encephalopathy: Secondary | ICD-10-CM | POA: Diagnosis not present

## 2021-07-15 DIAGNOSIS — I5021 Acute systolic (congestive) heart failure: Secondary | ICD-10-CM | POA: Diagnosis not present

## 2021-07-15 DIAGNOSIS — J189 Pneumonia, unspecified organism: Secondary | ICD-10-CM | POA: Diagnosis not present

## 2021-07-15 DIAGNOSIS — J690 Pneumonitis due to inhalation of food and vomit: Secondary | ICD-10-CM | POA: Diagnosis not present

## 2021-07-15 DIAGNOSIS — R7881 Bacteremia: Secondary | ICD-10-CM | POA: Diagnosis not present

## 2021-07-15 LAB — CULTURE, BLOOD (SINGLE): Special Requests: ADEQUATE

## 2021-07-15 LAB — GLUCOSE, CAPILLARY: Glucose-Capillary: 196 mg/dL — ABNORMAL HIGH (ref 70–99)

## 2021-07-15 NOTE — Progress Notes (Signed)
   07/15/21 1710  Clinical Encounter Type  Visited With Patient not available  Visit Type Other (Comment) (rapid response)  Spiritual Encounters  Spiritual Needs Other (Comment) (not assessed)  Chaplain Burris responded to rapid response. The medical issue was resolved but Pt not available for spiritual care due to meal and Pt receiving assistance to eat at this time.

## 2021-07-15 NOTE — Progress Notes (Signed)
Progress Note    Steve Lloyd  ZOX:[id number] DOB: [birth year]  DOA: 06/22/2021 PCP: Everardo Pacific, MD      Brief Narrative:    Medical records reviewed and are as summarized below:  Steve Lloyd is a 87 y.o. male with medical history significant of hypertension, hyperlipidemia, diabetes mellitus, pacemaker placement, CAD, CABG, atrial fibrillation on Eliquis, kidney stone, dementia, CKD stage IIIa, who presented to the hospital with fall, cough and change in mental status/confusion.  He did not have any chest pain or shortness of breath but his troponins were elevated (maximum troponin level was 764).  He was admitted to the hospital for bilateral pneumonia.  He was treated with empiric IV antibiotics.  Blood culture revealed Enterococcus faecalis and Staph epidermidis.  ID was consulted to assist with management.  He was seen in consultation by the cardiologist for elevated troponins.  2D echo revealed EF estimated at 25 to 30%, severely dilated right and left atria, moderate to severe tricuspid regurgitation and LV diastolic parameters were indeterminate.  Per cardiologist, he is not a good candidate ischemia evaluation/invasive work-up because of severe dementia.    Assessment/Plan:   Principal Problem:   CAP (community acquired pneumonia) Active Problems:   CAD s/p CABG [place] to LAD 1998   Hypercholesterolemia   Essential hypertension   Type II diabetes mellitus with renal manifestations (HCC)   Atrial fibrillation, chronic (HCC)   Dementia (HCC)   CKD (chronic kidney disease), stage IIIa   Hypokalemia   Elevated troponin   Fall at home, initial encounter   Acute metabolic encephalopathy   Normocytic anemia   Body mass index is 23.13 kg/m.   Bilateral pneumonia, Enterococcus faecalis, staph hemolyticus and Staph epidermidis bacteremia: Staph epidermidis and Staph hemolyticus grossly likely contaminants. Repeat chest x-ray on 07/13/2021 showed  worsening pneumonia likely from aspiration.  IV vancomycin has been changed to IV Unasyn.  Continue azithromycin.  No evidence of vegetation on 2D echo.  Follow-up with ID for further recommendations.  Dysphagia: Dysphagia 3 diet and nectar thick liquids.  Follow aspiration precautions.  Acute hypoxemic respiratory failure: He is on 5 L/min oxygen via nasal cannula. Taper off oxygen as able.  Elevated troponins, CAD s/p CABG: Maximum troponin was 764.  He is not a candidate for invasive cardiac work-up because of severe dementia.    Cardiomyopathy/chronic systolic heart CHF: 2D echo showed EF estimated at 25 to 30%.  Continue Toprol-XL and Lasix  Permanent atrial fibrillation, S/p permanent pacemaker: Eliquis on hold  Hyperkalemia: Improved  Acute metabolic encephalopathy with underlying severe dementia with behavioral disturbance: Continue supportive care  S/p fall at home, debility: PT recommends discharge to SNF.  Follow-up with social worker to assist with disposition.  Other comorbidities include type II DM, hypertension, CKD stage IIIa, chronic anemia/iron deficiency anemia  Discussed diagnoses and prognosis with his daughter, Steve Lloyd.  She is contemplating hospice but she is undecided.   Diet Order             DIET DYS 3 Room service appropriate? Yes with Assist; Fluid consistency: Nectar Thick  Diet effective now                      Consultants: Cardiologist Infectious disease  Procedures: None    Medications:    atorvastatin  10 mg Oral Daily   benazepril  5 mg Oral Daily   furosemide  20 mg Intravenous Daily   ipratropium-albuterol  3  mL Nebulization TID   LORazepam  0.5 mg Intravenous Once   metoprolol succinate  12.5 mg Oral Daily   rivastigmine  4.6 mg Transdermal Daily   sertraline  50 mg Oral Daily   Continuous Infusions:  ampicillin-sulbactam (UNASYN) IV 3 g (07/15/21 0537)   azithromycin 500 mg (07/15/21 0924)     Anti-infectives (From  admission, onward)    Start     Dose/Rate Route Frequency Ordered Stop   07/14/21 2200  vancomycin (VANCOREADY) IVPB 1750 mg/350 mL  Status:  Discontinued        1,750 mg 175 mL/hr over 120 Minutes Intravenous Every 48 hours 06/22/2021 2259 07/14/21 1725   07/14/21 1800  Ampicillin-Sulbactam (UNASYN) 3 g in sodium chloride 0.9 % 100 mL IVPB        3 g 200 mL/hr over 30 Minutes Intravenous Every 6 hours 07/14/21 1726     07/01/2021 2200  vancomycin (VANCOREADY) IVPB 1500 mg/300 mL        1,500 mg 150 mL/hr over 120 Minutes Intravenous  Once 07/13/2021 2153 07/13/21 0024   07/15/2021 0745  cefTRIAXone (ROCEPHIN) 2 g in sodium chloride 0.9 % 100 mL IVPB  Status:  Discontinued        2 g 200 mL/hr over 30 Minutes Intravenous Every 24 hours 06/17/2021 0743 06/20/2021 2154   07/08/2021 0745  azithromycin (ZITHROMAX) 500 mg in sodium chloride 0.9 % 250 mL IVPB        500 mg 250 mL/hr over 60 Minutes Intravenous Every 24 hours 06/18/2021 0743 07/17/21 0744              Family Communication/Anticipated D/C date and plan/Code Status   DVT prophylaxis: Place and maintain sequential compression device Start: 07/03/2021 1117     Code Status: DNR  Family Communication: Steve Lloyd , daughter Disposition Plan:    Status is: Inpatient  Remains inpatient appropriate because:IV treatments appropriate due to intensity of illness or inability to take PO and Inpatient level of care appropriate due to severity of illness  Dispo: The patient is from: Home              Anticipated d/c is to: SNF              Patient currently is not medically stable to d/c.   Difficult to place patient No           Subjective:   Interval events noted.  He has been intermittently agitated.  He has a Actuary at the bedside.  Objective:    Vitals:   07/15/21 0540 07/15/21 0718 07/15/21 0734 07/15/21 1348  BP: 103/83 126/62    Pulse: 71 67 66 64  Resp: 17 (!) 22 20 18   Temp: 97.8 F (36.6 C) 98.7 F (37.1 C)     TempSrc: Axillary Oral    SpO2: 92% 92% 92% 94%  Weight:      Height:       No data found.   Intake/Output Summary (Last 24 hours) at 07/15/2021 1357 Last data filed at 07/15/2021 1013 Gross per 24 hour  Intake 710 ml  Output --  Net 710 ml   Filed Weights   06/20/2021 0609  Weight: 67 kg    Exam:  GEN: NAD SKIN: Multiple bruises/erythematous areas on the extremities EYES: No pallor or icterus ENT: MMM CV: RRR PULM: No wheezing or rales heard ABD: soft, ND, NT, +BS CNS: Alert but disoriented, non focal EXT: No edema or tenderness  Data Reviewed:   I have personally reviewed following labs and imaging studies:  Labs: Labs show the following:   Basic Metabolic Panel: Recent Labs  Lab 07/05/2021 0616 07/11/2021 0832 07/13/21 0616 07/14/21 0615  NA 135  --  137  --   K 3.4*  --  3.7  --   CL 101  --  103  --   CO2 21*  --  22  --   GLUCOSE 139*  --  155*  --   BUN 31*  --  30*  --   CREATININE 1.26*  --  1.35* 1.47*  CALCIUM 8.5*  --  8.7*  --   MG  --  2.0  --   --    GFR Estimated Creatinine Clearance: 31.9 mL/min (A) (by C-G formula based on SCr of 1.47 mg/dL (H)). Liver Function Tests: Recent Labs  Lab 06/19/2021 0616  AST 30  ALT 22  ALKPHOS 57  BILITOT 1.2  PROT 6.1*  ALBUMIN 2.9*   Recent Labs  Lab 06/27/2021 0616  LIPASE 38   No results for input(s): AMMONIA in the last 168 hours. Coagulation profile Recent Labs  Lab 06/25/2021 0616  INR 1.7*    CBC: Recent Labs  Lab 06/24/2021 0616 06/17/2021 0631 07/13/21 0616  WBC 8.5  --  12.1*  NEUTROABS 6.5  --   --   HGB 9.1* 8.8* 9.3*  HCT 26.8* 26.4* 28.6*  MCV 94.7  --  95.7  PLT 176  --  198   Cardiac Enzymes: No results for input(s): CKTOTAL, CKMB, CKMBINDEX, TROPONINI in the last 168 hours. BNP (last 3 results) No results for input(s): PROBNP in the last 8760 hours. CBG: Recent Labs  Lab 07/13/21 0822 07/15/21 0717  GLUCAP 135* 196*   D-Dimer: No results for  input(s): DDIMER in the last 72 hours. Hgb A1c: No results for input(s): HGBA1C in the last 72 hours.  Lipid Profile: Recent Labs    07/13/21 0616  CHOL 91  HDL 36*  LDLCALC 40  TRIG 74  CHOLHDL 2.5   Thyroid function studies: No results for input(s): TSH, T4TOTAL, T3FREE, THYROIDAB in the last 72 hours.  Invalid input(s): FREET3 Anemia work up: Recent Labs    07/13/21 0616  VITAMINB12 528  FOLATE 19.7  FERRITIN 70  TIBC 270  IRON 17*  RETICCTPCT 3.1   Sepsis Labs: Recent Labs  Lab 06/26/2021 0612 06/21/2021 0616 06/17/2021 0837 07/13/21 0616 07/14/21 0615  PROCALCITON  --  0.11  --   --  0.27  WBC  --  8.5  --  12.1*  --   LATICACIDVEN 1.8  --  0.9  --   --     Microbiology Recent Results (from the past 240 hour(s))  Blood culture (routine single)     Status: Abnormal   Collection Time: 07/08/2021  6:12 AM   Specimen: BLOOD  Result Value Ref Range Status   Specimen Description   Final    BLOOD  LAC Performed at [facility], [address] [place], [place] [address]    Special Requests   Final    BOTTLES DRAWN AEROBIC AND ANAEROBIC Blood Culture adequate volume Performed at [facility], [address] [place], [place] [address]    Culture  Setup Time   Final    GRAM POSITIVE COCCI Organism ID to follow CRITICAL RESULT CALLED TO, READ BACK BY AND VERIFIED WITH: JASON ROBBINS PHARMD 2139 06/21/2021 HNM IN BOTH AEROBIC AND ANAEROBIC  BOTTLES Performed at [facility], [place], [place] [address]    Culture (A)  Final    ENTEROCOCCUS FAECALIS STAPHYLOCOCCUS EPIDERMIDIS STAPHYLOCOCCUS HAEMOLYTICUS THE SIGNIFICANCE OF ISOLATING THIS ORGANISM FROM A SINGLE SET OF BLOOD CULTURES WHEN MULTIPLE SETS ARE DRAWN IS UNCERTAIN. PLEASE NOTIFY THE MICROBIOLOGY DEPARTMENT WITHIN ONE WEEK IF SPECIATION AND SENSITIVITIES ARE REQUIRED. Performed at [facility], [place] [address] [place], [place] [address]    Report Status  07/15/2021 FINAL  Final   Organism ID, Bacteria ENTEROCOCCUS FAECALIS  Final      Susceptibility   Enterococcus faecalis - MIC*    AMPICILLIN <=2 SENSITIVE Sensitive     VANCOMYCIN <=0.5 SENSITIVE Sensitive     GENTAMICIN SYNERGY SENSITIVE Sensitive     * ENTEROCOCCUS FAECALIS  Blood Culture ID Panel (Reflexed)     Status: Abnormal   Collection Time: 07/05/2021  6:12 AM  Result Value Ref Range Status   Enterococcus faecalis DETECTED (A) NOT DETECTED Final    Comment: CRITICAL RESULT CALLED TO, READ BACK BY AND VERIFIED WITH: JASON ROBBINS PHARMD 2139 07/08/2021 HNM    Enterococcus Faecium NOT DETECTED NOT DETECTED Final   Listeria monocytogenes NOT DETECTED NOT DETECTED Final   Staphylococcus species DETECTED (A) NOT DETECTED Final    Comment: CRITICAL RESULT CALLED TO, READ BACK BY AND VERIFIED WITH: JASON ROBBINS PHARMD 2139 06/16/2021 HNM    Staphylococcus aureus (BCID) NOT DETECTED NOT DETECTED Final   Staphylococcus epidermidis DETECTED (A) NOT DETECTED Final    Comment: Methicillin (oxacillin) resistant coagulase negative staphylococcus. Possible blood culture contaminant (unless isolated from more than one blood culture draw or clinical case suggests pathogenicity). No antibiotic treatment is indicated for blood  culture contaminants. CRITICAL RESULT CALLED TO, READ BACK BY AND VERIFIED WITH: JASON ROBBINS PHARMD 2139 07/04/2021 HNM    Staphylococcus lugdunensis NOT DETECTED NOT DETECTED Final   Streptococcus species NOT DETECTED NOT DETECTED Final   Streptococcus agalactiae NOT DETECTED NOT DETECTED Final   Streptococcus pneumoniae NOT DETECTED NOT DETECTED Final   Streptococcus pyogenes NOT DETECTED NOT DETECTED Final   A.calcoaceticus-baumannii NOT DETECTED NOT DETECTED Final   Bacteroides fragilis NOT DETECTED NOT DETECTED Final   Enterobacterales NOT DETECTED NOT DETECTED Final   Enterobacter cloacae complex NOT DETECTED NOT DETECTED Final   Escherichia coli NOT DETECTED NOT  DETECTED Final   Klebsiella aerogenes NOT DETECTED NOT DETECTED Final   Klebsiella oxytoca NOT DETECTED NOT DETECTED Final   Klebsiella pneumoniae NOT DETECTED NOT DETECTED Final   Proteus species NOT DETECTED NOT DETECTED Final   Salmonella species NOT DETECTED NOT DETECTED Final   Serratia marcescens NOT DETECTED NOT DETECTED Final   Haemophilus influenzae NOT DETECTED NOT DETECTED Final   Neisseria meningitidis NOT DETECTED NOT DETECTED Final   Pseudomonas aeruginosa NOT DETECTED NOT DETECTED Final   Stenotrophomonas maltophilia NOT DETECTED NOT DETECTED Final   Candida albicans NOT DETECTED NOT DETECTED Final   Candida auris NOT DETECTED NOT DETECTED Final   Candida glabrata NOT DETECTED NOT DETECTED Final   Candida krusei NOT DETECTED NOT DETECTED Final   Candida parapsilosis NOT DETECTED NOT DETECTED Final   Candida tropicalis NOT DETECTED NOT DETECTED Final   Cryptococcus neoformans/gattii NOT DETECTED NOT DETECTED Final   Methicillin resistance mecA/C DETECTED (A) NOT DETECTED Final    Comment: CRITICAL RESULT CALLED TO, READ BACK BY AND VERIFIED WITH: JASON ROBBINS PHARMD 2139 07/03/2021 HNM    Vancomycin resistance NOT DETECTED NOT DETECTED Final  Comment: Performed at [facility], [address] [place], [place] [address]  Resp Panel by RT-PCR (Flu A&B, Covid) Nasopharyngeal Swab     Status: None   Collection Time: 06/26/2021  6:16 AM   Specimen: Nasopharyngeal Swab; Nasopharyngeal(NP) swabs in vial transport medium  Result Value Ref Range Status   SARS Coronavirus 2 by RT PCR NEGATIVE NEGATIVE Final    Comment: (NOTE) SARS-CoV-2 target nucleic acids are NOT DETECTED.  The SARS-CoV-2 RNA is generally detectable in upper respiratory specimens during the acute phase of infection. The lowest concentration of SARS-CoV-2 viral copies this assay can detect is 138 copies/mL. A negative result does not preclude SARS-Cov-2 infection and should not be used as the sole  basis for treatment or other patient management decisions. A negative result may occur with  improper specimen collection/handling, submission of specimen other than nasopharyngeal swab, presence of viral mutation(s) within the areas targeted by this assay, and inadequate number of viral copies(<138 copies/mL). A negative result must be combined with clinical observations, patient history, and epidemiological information. The expected result is Negative.  Fact Sheet for Patients:  EntrepreneurPulse.com.au  Fact Sheet for Healthcare Providers:  IncredibleEmployment.be  This test is no t yet approved or cleared by the [place] FDA and  has been authorized for detection and/or diagnosis of SARS-CoV-2 by FDA under an Emergency Use Authorization (EUA). This EUA will remain  in effect (meaning this test can be used) for the duration of the COVID-19 declaration under Section 564(b)(1) of the Act, 21 U.S.C.section 360bbb-3(b)(1), unless the authorization is terminated  or revoked sooner.       Influenza A by PCR NEGATIVE NEGATIVE Final   Influenza B by PCR NEGATIVE NEGATIVE Final    Comment: (NOTE) The Xpert Xpress SARS-CoV-2/FLU/RSV plus assay is intended as an aid in the diagnosis of influenza from Nasopharyngeal swab specimens and should not be used as a sole basis for treatment. Nasal washings and aspirates are unacceptable for Xpert Xpress SARS-CoV-2/FLU/RSV testing.  Fact Sheet for Patients: EntrepreneurPulse.com.au  Fact Sheet for Healthcare Providers: IncredibleEmployment.be  This test is not yet approved or cleared by the [place] FDA and has been authorized for detection and/or diagnosis of SARS-CoV-2 by FDA under an Emergency Use Authorization (EUA). This EUA will remain in effect (meaning this test can be used) for the duration of the COVID-19 declaration under Section 564(b)(1) of the Act,  21 U.S.C. section 360bbb-3(b)(1), unless the authorization is terminated or revoked.  Performed at [facility], [address], [place], [place] [address]   Urine Culture     Status: None   Collection Time: 06/22/2021 12:00 PM   Specimen: Urine, Random  Result Value Ref Range Status   Specimen Description   Final    URINE, RANDOM Performed at [facility], [address], [place], [place] [address]    Special Requests   Final    NONE Performed at [facility], [address], [place], [place] [address]    Culture   Final    NO GROWTH Performed at [facility], [place] [address], [place], [place] [address]    Report Status 07/13/2021 FINAL  Final  CULTURE, BLOOD (ROUTINE X 2) w Reflex to ID Panel     Status: None (Preliminary result)   Collection Time: 07/13/21 12:27 PM   Specimen: BLOOD  Result Value Ref Range Status   Specimen Description BLOOD LEFT ANTECUBITAL  Final   Special Requests   Final  BOTTLES DRAWN AEROBIC AND ANAEROBIC Blood Culture adequate volume   Culture   Final    NO GROWTH 2 DAYS Performed at [facility], [address] [place], [place] [address]    Report Status PENDING  Incomplete  CULTURE, BLOOD (ROUTINE X 2) w Reflex to ID Panel     Status: None (Preliminary result)   Collection Time: 07/13/21 12:30 PM   Specimen: BLOOD  Result Value Ref Range Status   Specimen Description BLOOD BLOOD LEFT HAND  Final   Special Requests   Final    BOTTLES DRAWN AEROBIC AND ANAEROBIC Blood Culture adequate volume   Culture   Final    NO GROWTH 2 DAYS Performed at [facility], [address], [place], [place] [address]    Report Status PENDING  Incomplete  C Difficile Quick Screen w PCR reflex     Status: None   Collection Time: 07/14/21  5:34 PM   Specimen: STOOL  Result Value Ref Range Status   C Diff antigen NEGATIVE NEGATIVE Final   C Diff toxin NEGATIVE NEGATIVE Final   C Diff interpretation No C.  difficile detected.  Final    Comment: Performed at [facility], [address] [place], [place] [address]  Gastrointestinal Panel by PCR , Stool     Status: None   Collection Time: 07/14/21  5:34 PM   Specimen: STOOL  Result Value Ref Range Status   Campylobacter species NOT DETECTED NOT DETECTED Final   Plesimonas shigelloides NOT DETECTED NOT DETECTED Final   Salmonella species NOT DETECTED NOT DETECTED Final   Yersinia enterocolitica NOT DETECTED NOT DETECTED Final   Vibrio species NOT DETECTED NOT DETECTED Final   Vibrio cholerae NOT DETECTED NOT DETECTED Final   Enteroaggregative E coli (EAEC) NOT DETECTED NOT DETECTED Final   Enteropathogenic E coli (EPEC) NOT DETECTED NOT DETECTED Final   Enterotoxigenic E coli (ETEC) NOT DETECTED NOT DETECTED Final   Shiga like toxin producing E coli (STEC) NOT DETECTED NOT DETECTED Final   Shigella/Enteroinvasive E coli (EIEC) NOT DETECTED NOT DETECTED Final   Cryptosporidium NOT DETECTED NOT DETECTED Final   Cyclospora cayetanensis NOT DETECTED NOT DETECTED Final   Entamoeba histolytica NOT DETECTED NOT DETECTED Final   Giardia lamblia NOT DETECTED NOT DETECTED Final   Adenovirus F40/41 NOT DETECTED NOT DETECTED Final   Astrovirus NOT DETECTED NOT DETECTED Final   Norovirus GI/GII NOT DETECTED NOT DETECTED Final   Rotavirus A NOT DETECTED NOT DETECTED Final   Sapovirus (I, II, IV, and V) NOT DETECTED NOT DETECTED Final    Comment: Performed at [facility], [address] [place], [place] [address]    Procedures and diagnostic studies:  No results found.             LOS: 3 days   Deunta Beneke  [facility]   Pager on [email or web address]. If 7PM-7AM, please contact night-coverage at [email or web address]     07/15/2021, 1:57 PM

## 2021-07-15 NOTE — Care Management Important Message (Signed)
Important Message  Patient Details  Name: Steve Lloyd MRN: [id number] Date of Birth: 05-Aug-1930   Medicare Important Message Given:  Yes     Dannette Barbara 07/15/2021, 11:36 AM

## 2021-07-15 NOTE — Progress Notes (Signed)
Physical Therapy Treatment Patient Details Name: Steve Lloyd MRN: [id number] DOB: 1930/01/29 Today's Date: 07/15/2021   History of Present Illness 87 y.o. male with a history as listed below which includes some mild dementia and who presents with acute worsening mental status.  His family reported to paramedics that he had a fall about 4 days ago and was evaluated and medically cleared.  However since that time he has gradually worsened and his altered mental status has become severe tonight.  Family reports that since then he has had multiple falls as and been ambulating around the house urinating and defecating "all over." Pt w/u for bilateral PNA.    PT Comments    Pt seen briefly for modified session due to decreased tolerance for activity and inability to maintain eyes open during session. Pt required Min/ModA for bed mobility, rolling L<>R with support of railing. B LE ROM performed while supine and completing mobility tasks in bed.  Pt unable to tolerate OOB this date. O2 sats noted to be in low 80's on 4L, nursing notified and increased to 5L resulting in sats raising to 88-89%.  Nursing will continue to monitor.  Continue per POC, pt will benefit from SNF level of care once medically stable and ready for d/c.    Recommendations for follow up therapy are one component of a multi-disciplinary discharge planning process, led by the attending physician.  Recommendations may be updated based on patient status, additional functional criteria and insurance authorization.  Follow Up Recommendations  SNF;Supervision/Assistance - 24 hour     Equipment Recommendations       Recommendations for Other Services       Precautions / Restrictions Precautions Precautions: Fall Restrictions Weight Bearing Restrictions: No     Mobility  Bed Mobility Overal bed mobility: Needs Assistance Bed Mobility: Rolling Rolling: Min assist;Mod assist         General bed mobility comments:   (Repeated verbal and tactile cues to complete tasks)    Transfers                 General transfer comment:  (Deferred due to pt's inability to follow commands and maintain task engagement)  Ambulation/Gait                 Stairs             Wheelchair Mobility    Modified Rankin (Stroke Patients Only)       Balance                                            Cognition Arousal/Alertness: Lethargic (opens eyes when asked) Behavior During Therapy: WFL for tasks assessed/performed Overall Cognitive Status: Difficult to assess                                 General Comments:  (Eyes remained closed throughout session)      Exercises      General Comments        Pertinent Vitals/Pain Pain Assessment: No/denies pain    Home Living                      Prior Function            PT Goals (current goals can now be found in  the care plan section) Acute Rehab PT Goals Patient Stated Goal: daughter hoping pt can go to rehab to get safer and more steady    Frequency    Min 2X/week      PT Plan Current plan remains appropriate    Co-evaluation              AM-PAC PT "6 Clicks" Mobility   Outcome Measure  Help needed turning from your back to your side while in a flat bed without using bedrails?: A Little Help needed moving from lying on your back to sitting on the side of a flat bed without using bedrails?: A Little Help needed moving to and from a bed to a chair (including a wheelchair)?: A Little Help needed standing up from a chair using your arms (e.g., wheelchair or bedside chair)?: A Lot Help needed to walk in hospital room?: A Lot Help needed climbing 3-5 steps with a railing? : A Lot 6 Click Score: 15    End of Session Equipment Utilized During Treatment: Oxygen (Pt on 4L O2 initially with O2 sats in low 80's.  Nsg increeased to 5L where sats improved to 88%) Activity Tolerance:  Patient limited by lethargy Patient left: in bed;with call bell/phone within reach;with bed alarm set;with nursing/sitter in room Nurse Communication: Mobility status (Increased O2 needs, Nsg to monitor closely) PT Visit Diagnosis: Muscle weakness (generalized) (M62.81);Difficulty in walking, not elsewhere classified (R26.2);Unsteadiness on feet (R26.81)     Time: 1300-1315 PT Time Calculation (min) (ACUTE ONLY): 15 min  Charges:  $Therapeutic Activity: 8-22 mins                    Steve Lloyd, PTA    Steve Lloyd 07/15/2021, 2:26 PM

## 2021-07-15 NOTE — Progress Notes (Signed)
46 - NT sitter notified RN of increased agitation and movement.  On exam, pt placed on O2 monitor and found to be at 78% on 5L O2 via [place].  O2 increased to 6L with O2 sat increase to 84%.  Rapid response called.  When O2 sat placed on ear, pt level at 100%.  Pt placed on high flow O2 at 7L and peripheral O2 sat increased to 97%.  Pt to be maintained on high flow O2 at 7L.  Pt mentating at baseline.

## 2021-07-15 NOTE — Evaluation (Signed)
Clinical/Bedside Swallow Evaluation Patient Details  Name: Steve Lloyd MRN: [id number] Date of Birth: [birth year]  Today's Date: 07/15/2021 Time: SLP Start Time (ACUTE ONLY): 0935 SLP Stop Time (ACUTE ONLY): 0347 SLP Time Calculation (min) (ACUTE ONLY): 60 min  Past Medical History:  Past Medical History:  Diagnosis Date   Atrial fibrillation with slow ventricular response ([place])    hx/notes 06/20/2016   Coronary artery disease    Hyperlipidemia    Kidney stones    Pneumonia ~ 1950   Presence of permanent cardiac pacemaker    S/P cardiac pacemaker procedure, [employer] 06/20/16  06/21/2016   Type II diabetes mellitus ([place])    Past Surgical History:  Past Surgical History:  Procedure Laterality Date   APPENDECTOMY     CATARACT EXTRACTION W/ INTRAOCULAR LENS  IMPLANT, BILATERAL Bilateral    CORONARY ARTERY BYPASS GRAFT  1998   single-vessel LIMA to LAD/notes 06/20/2016   EP IMPLANTABLE DEVICE N/A 06/20/2016   Procedure: Pacemaker Implant;  Surgeon: Sanda Klein, MD;  Location: [facility];  Service: Cardiovascular;  Laterality: N/A;   INGUINAL HERNIA REPAIR Right    INSERT / REPLACE / REMOVE PACEMAKER  06/20/2016   HPI:  Pt is a 87 y.o. male with medical history significant of Dementai, hypertension, hyperlipidemia, diabetes mellitus, pacemaker placement, CAD, CABG, atrial fibrillation on Eliquis, kidney stone, CKD stage IIIa, who presents with fall, altered mental status and cough.  Per patient's daughter and wife at the bedside, patient has been more confused and had multiple falls in the past several days.  At his normal baseline, patient recognizes his daughter and wife, but is usually not orientated to the time and place.  CXR Imaging: Bilateral airspace disease with history suggesting pneumonia.    Assessment / Plan / Recommendation  Clinical Impression  Pt appears to present w/ pharyngeal phase dysphagia in light of declined Cognitive status; Baseline Dementia. Pt  exhibits dysphagia and overt s/s of aspiration w/ thin liquids. Cognitive decline can impact overall awareness/timing of swallow and safety during po tasks which increases risk for aspiration, choking. Pt's risk for aspiration is present but can be reduced when following general aspiration precautions and using a modified diet consistency w/ Nectar liquids. He required verbal/visual cues for during po tasks.      Pt consumed trials of ice chips, purees, soft solids and Thin and Nectar liquids w/ overt clinical s/s of aspiration noted w/ Thin liquids(immediate and delayed coughing); No immediate, overt clinical s/s of aspiration noted w/ remaining consistencies; no decline in vocal quality; no cough, and no decline in respiratory status during/post trials. Oral phase was grossly adequate for bolus management and oral clearing of the boluses given - softened solids. Mastication of soft solids appropriate. Pt attempted self-feeding but required Mod support and guidance d/t Cognitive decline. Was able to hold own Cup during drinking which improves safety of swallowing. OM Exam appeared [facility] w/ No unilateral weakness noted. Some confusion of OM tasks and oral care.         D/t pt's Baseline declined Cognitive and his risk for aspiration, recommend initiation of the dysphagia level 3(mech soft) w/ Nectar liquids via Cup; general aspiration precautions; reduce Distractions during meals and engage pt during po's at meal for self-feeding. Pills Whole in Puree for safer swallowing. Support w/ feeding at meals as needed. MD/NSG updated. SLP Visit Diagnosis: Dysphagia, oropharyngeal phase (R13.12) (Dementia baseline)    Aspiration Risk  Mild aspiration risk;Moderate aspiration risk;Risk for inadequate nutrition/hydration  Diet Recommendation   dysphagia level 3(mech soft) w/ Nectar liquids via Cup; general aspiration precautions; reduce Distractions during meals and engage pt during po's at meal for self-feeding.  Support w/ feeding at meals as needed.   Medication Administration: Whole meds with puree    Other  Recommendations Recommended Consults:  (Dieticia f/u; Palliative care f/u) Oral Care Recommendations: Oral care BID;Oral care before and after PO;Staff/trained caregiver to provide oral care Other Recommendations: Order thickener from pharmacy;Prohibited food (jello, ice cream, thin soups);Remove water pitcher;Have oral suction available    Recommendations for follow up therapy are one component of a multi-disciplinary discharge planning process, led by the attending physician.  Recommendations may be updated based on patient status, additional functional criteria and insurance authorization.  Follow up Recommendations None      Frequency and Duration min 1 x/week  1 week       Prognosis Prognosis for Safe Diet Advancement: Fair Barriers to Reach Goals: Cognitive deficits;Time post onset;Severity of deficits      Swallow Study   General Date of Onset: 06/25/2021 HPI: Pt is a 87 y.o. male with medical history significant of Dementai, hypertension, hyperlipidemia, diabetes mellitus, pacemaker placement, CAD, CABG, atrial fibrillation on Eliquis, kidney stone, CKD stage IIIa, who presents with fall, altered mental status and cough.  Per patient's daughter and wife at the bedside, patient has been more confused and had multiple falls in the past several days.  At his normal baseline, patient recognizes his daughter and wife, but is usually not orientated to the time and place.  CXR Imaging: Bilateral airspace disease with history suggesting pneumonia. Type of Study: Bedside Swallow Evaluation Previous Swallow Assessment: none Diet Prior to this Study: Regular;Thin liquids Temperature Spikes Noted: No (wbc 12.1; troponin elevated) Respiratory Status: Nasal cannula (3L) History of Recent Intubation: No Behavior/Cognition: Alert;Cooperative;Pleasant mood;Confused;Requires cueing;Distractible  (dementia) Oral Cavity Assessment: Within Functional Limits Oral Care Completed by SLP: Yes Oral Cavity - Dentition: Dentures, top;Dentures, bottom (pt was not aware) Vision: Functional for self-feeding Self-Feeding Abilities: Able to feed self;Needs assist;Needs set up Patient Positioning: Upright in bed (needed positioning) Baseline Vocal Quality: Normal (muttered speech at times) Volitional Cough: Strong Volitional Swallow: Unable to elicit    Oral/Motor/Sensory Function Overall Oral Motor/Sensory Function: Within functional limits   Ice Chips Ice chips: Within functional limits Presentation: Spoon (fed; 3 trials)   Thin Liquid Thin Liquid: Impaired Presentation: Cup;Self Fed (3 trials) Pharyngeal  Phase Impairments: Cough - Delayed;Cough - Immediate (x3/3)    Nectar Thick Nectar Thick Liquid: Within functional limits Presentation: Cup;Self Fed (~4 ozs) Other Comments: mild throat clear at end of intake   Honey Thick Honey Thick Liquid: Not tested   Puree Puree: Within functional limits Presentation: Self Fed;Spoon (supported; 3ozs)   Solid     Solid: Impaired (min) Presentation: Spoon (fed; 4 trials) Oral Phase Impairments:  (min increased time) Pharyngeal Phase Impairments:  (none)        Orinda Kenner, MS, [employer] [facility] [phone] Steve Lloyd 07/15/2021,2:44 PM

## 2021-07-15 NOTE — Progress Notes (Signed)
Progress Note  Patient Name: Steve Lloyd Date of Encounter: 07/15/2021  [facility] Cardiologist: Sanda Klein, MD   Subjective   Sleepy per sitter.  Patient awakens to voice and denies chest pain, shortness of breath, and palpitations.  Inpatient Medications    Scheduled Meds:  atorvastatin  10 mg Oral Daily   benazepril  5 mg Oral Daily   furosemide  20 mg Intravenous Daily   ipratropium-albuterol  3 mL Nebulization TID   LORazepam  0.5 mg Intravenous Once   metoprolol succinate  12.5 mg Oral Daily   rivastigmine  4.6 mg Transdermal Daily   sertraline  50 mg Oral Daily   Continuous Infusions:  ampicillin-sulbactam (UNASYN) IV 3 g (07/15/21 0537)   azithromycin 500 mg (07/15/21 0924)   PRN Meds: acetaminophen, albuterol, dextromethorphan-guaiFENesin, haloperidol lactate, hydrALAZINE, morphine injection, OLANZapine zydis, ondansetron (ZOFRAN) IV   Vital Signs    Vitals:   07/15/21 0540 07/15/21 0718 07/15/21 0734 07/15/21 1348  BP: 103/83 126/62    Pulse: 71 67 66 64  Resp: 17 (!) 22 20 18   Temp: 97.8 F (36.6 C) 98.7 F (37.1 C)    TempSrc: Axillary Oral    SpO2: 92% 92% 92% 94%  Weight:      Height:        Intake/Output Summary (Last 24 hours) at 07/15/2021 1458 Last data filed at 07/15/2021 1013 Gross per 24 hour  Intake 710 ml  Output --  Net 710 ml   Last 3 Weights 06/26/2021 04/25/2021 12/15/2019  Weight (lbs) 147 lb 11.3 oz 148 lb 9.6 oz 170 lb  Weight (kg) 67 kg 67.405 kg 77.111 kg      Telemetry    Atrial fibrillation with demand VP.  Ventricular rates 60-80 bpm - Personally Reviewed  ECG    No new tracing  Physical Exam   GEN: No acute distress.   Neck: JVP 6-8 cm Cardiac: RRR with 1/6 systolic murmur. Respiratory: Mildly diminished breath sounds throughout. GI: Soft, nontender, non-distended  MS: No edema; No deformity. Neuro:  Nonfocal  Psych: Normal affect   Labs    High Sensitivity Troponin:   Recent Labs  Lab  06/27/2021 0832 06/16/2021 1630 06/28/2021 1930 07/13/21 1011 07/13/21 1230  TROPONINIHS 101* 731* 764* 673* 759*     Chemistry Recent Labs  Lab 07/13/2021 0616 07/08/2021 0832 07/13/21 0616 07/14/21 0615  NA 135  --  137  --   K 3.4*  --  3.7  --   CL 101  --  103  --   CO2 21*  --  22  --   GLUCOSE 139*  --  155*  --   BUN 31*  --  30*  --   CREATININE 1.26*  --  1.35* 1.47*  CALCIUM 8.5*  --  8.7*  --   MG  --  2.0  --   --   PROT 6.1*  --   --   --   ALBUMIN 2.9*  --   --   --   AST 30  --   --   --   ALT 22  --   --   --   ALKPHOS 57  --   --   --   BILITOT 1.2  --   --   --   GFRNONAA 55*  --  50* 45*  ANIONGAP 13  --  12  --     Lipids  Recent Labs  Lab 07/13/21 [phone]  CHOL 91  TRIG 74  HDL 36*  LDLCALC 40  CHOLHDL 2.5    Hematology Recent Labs  Lab 07/03/2021 0616 06/16/2021 0631 07/13/21 0616  WBC 8.5  --  12.1*  RBC 2.83*  --  2.99*  3.01*  HGB 9.1* 8.8* 9.3*  HCT 26.8* 26.4* 28.6*  MCV 94.7  --  95.7  MCH 32.2  --  31.1  MCHC 34.0  --  32.5  RDW 15.3  --  15.6*  PLT 176  --  198   Thyroid No results for input(s): TSH, FREET4 in the last 168 hours.  BNP Recent Labs  Lab 07/13/2021 0616  BNP 849.7*    DDimer No results for input(s): DDIMER in the last 168 hours.   Radiology    No results found.  Cardiac Studies   TTE (06/17/2021):  1. Left ventricular ejection fraction, by estimation, is 25 to 30%. The  left ventricle has severely decreased function. The left ventricle  demonstrates global hypokinesis. Left ventricular diastolic parameters are  indeterminate.   2. Right ventricular systolic function is mildly reduced. The right  ventricular size is normal. There is severely elevated pulmonary artery  systolic pressure. The estimated right ventricular systolic pressure is  82.8 mmHg.   3. Left atrial size was severely dilated.   4. Right atrial size was severely dilated.   5. The mitral valve is normal in structure. Moderate mitral valve   regurgitation.   6. Tricuspid valve regurgitation is moderate to severe.   7. The aortic valve is tricuspid. Aortic valve regurgitation is mild.  Mild to moderate aortic valve sclerosis/calcification is present, without  any evidence of aortic stenosis.   8. The inferior vena cava is dilated in size with <50% respiratory  variability, suggesting right atrial pressure of 15 mmHg.   Patient Profile     87 y.o. male with h/o CAD s/p CABG, permanent atrial fibrillation, PPM, and dementia, admitted with worsening delirium over the last few weeks.  Assessment & Plan    Acute HFrEF: LVEF newly reduced (20-25% compared with 55-60% in 2017).  Patient with elevated troponin on admission.  He is currently asymptomatic.  Question progression of ischemic heart disease given prior CABG.  Patient is poor candidate for cath/PCI.  He has mildly elevated JVP but otherwise appears euvolemic with poor oral intake. - Continue current doses of metoprolol and benazepril.  Intermittently soft BP precludes dose escalation. - Hold furosemide pending repeat labs. - Check BMP (ordered for tomorrow AM by primary team).  Coronary artery disease and elevated troponin: - No plans for cath given comorbidities, as risks outweight benefits. - Continue atorvastatin. - Add low-dose aspirin if hemoglobin remains stable (not rechecked in the last 2 days).  Repeat CBC ordered for tomorrow by primary team.  Permanent atrial fibrillation: Ventricular rates adequately controlled.  Apixaban on hold given concern for anemia on admission (hemoglobin last checked 2 days ago). - Continue current dose of metoprolol. - Defer restarting apixaban pending follow-up/workup of anemia per primary team.  PNA: - Per ID and IM.  For questions or updates, please contact [place] Please consult [email or web address] for contact info under [facility].     Signed, Nelva Bush, MD  07/15/2021, 2:58 PM

## 2021-07-15 NOTE — Significant Event (Addendum)
Rapid Response Event Note   Reason for Call :  hypoxia  Initial Focused Assessment:  Rapid response RN arrived in patient's room with patient connected to dinamap vital sign device surrounded by 1C staff and AC. RT arrived just as this RN did. Dinamap at time of arrival showing oxygen saturation of mid 80s on 6 L nasal cannula. Per report from 1C staff, patient's sitter noticed that patient was more agitated. When staff further assessed, patient's oxygen saturations were reading in 70s on 5 L nasal cannula. Staff then upped oxygen to 6L and called rapid response team.   Patient reported no shortness of breath. No increased work of breathing observed. Patient's hands did feel a little cold, so while RT was setting up wall high flow (not heated), moved patient's pulse ox to ear where it read 100% on at that point on 11 L nasal cannula.   Patient's lungs with fine crackles, but not a new finding for patient per RT.  Interventions:  RT titrated down patient's oxygen based on his oxygen saturations. Patient left on 7L nasal cannula with high flow set up still in place in case staff need to increase oxygen in the future. When left both pulse ox on hand and pulse ox on ear were both reading upper 90s on 7 L.  Plan of Care:  Patient to remain on 1C for now. Team will recall rapid response if further needs.  Event Summary:   Call Time: 16:58 Arrival Time: 17:01 End Time: 17:12  Klein Willcox, Jaynie Bream, RN

## 2021-07-15 NOTE — Progress Notes (Signed)
ID Pt in bed Says he is okay Wants to go home No sob at rest But pulse ox was low as per OT  O/e Patient Vitals for the past 24 hrs:  BP Temp Temp src Pulse Resp SpO2  07/15/21 1348 -- -- -- 64 18 94 %  07/15/21 0734 -- -- -- 66 20 92 %  07/15/21 0718 126/62 98.7 F (37.1 C) Oral 67 (!) 22 92 %  07/15/21 0540 103/83 97.8 F (36.6 C) Axillary 71 17 92 %  07/14/21 2234 110/71 98 F (36.7 C) -- 68 17 94 %  07/14/21 2100 -- -- -- -- -- 93 %  07/14/21 2040 -- -- -- -- -- (!) 83 %    Chest b/l air entry Few basal creps Hs irregular Pace maker Abd soft Abrasions rt leg  Labs CBC Latest Ref Rng & Units 07/13/2021 07/07/2021 07/15/2021  WBC 4.0 - 10.5 K/uL 12.1(H) - 8.5  Hemoglobin 13.0 - 17.0 g/dL 9.3(L) 8.8(L) 9.1(L)  Hematocrit 39.0 - 52.0 % 28.6(L) 26.4(L) 26.8(L)  Platelets 150 - 400 K/uL 198 - 176     CMP Latest Ref Rng & Units 07/14/2021 07/13/2021 06/28/2021  Glucose 70 - 99 mg/dL - 155(H) 139(H)  BUN 8 - 23 mg/dL - 30(H) 31(H)  Creatinine 0.61 - 1.24 mg/dL 1.47(H) 1.35(H) 1.26(H)  Sodium 135 - 145 mmol/L - 137 135  Potassium 3.5 - 5.1 mmol/L - 3.7 3.4(L)  Chloride 98 - 111 mmol/L - 103 101  CO2 22 - 32 mmol/L - 22 21(L)  Calcium 8.9 - 10.3 mg/dL - 8.7(L) 8.5(L)  Total Protein 6.5 - 8.1 g/dL - - 6.1(L)  Total Bilirubin 0.3 - 1.2 mg/dL - - 1.2  Alkaline Phos 38 - 126 U/L - - 57  AST 15 - 41 U/L - - 30  ALT 0 - 44 U/L - - 22     Micro BC from ED - has enterococcus, staph hemolyticus and staph epi Repeat BC from 07/13/21 pending  Impression/recommendation Multiple falls- last leading to abrasion rt leg    aspiration pneumonia- rt side- was coughing when he took liquids yesterday   evaluated by speech today and found to haveovert clinical signs/symptoms of aspiration for thin liquids Recommendation was dypshagia level 3 diet Pt on unasyn- can stop azithromycin   Enterococcus bacteremia along with staph epi and staph hemolyticus in blood culture - 1 set drawn in  ED. The coag neg staphs are likely contaminants Enterococcus faecalis cannot be deemed a contaminant ,and could be due to the aspiration pneumonia  On ampicillin/sulbactam should treat this Pacemaker in place 2 d echo does not show any vegetations TEE may not be possible due to his poor general condition If repeat BC remains negative will need  unasyn for a week    Ischemic cardiomyopathy /CHF/ - EF 25-30% CHF Elevated troponin - followed by cardiology  Afib   Diarrhea- present on admission -- having more liquid stools here-  cdiff and Gi panel negative   Dementia   Daughter contemplating hospice care as per [facility] Discussed the management with the care team ID will follow remotely this weekend - call if needed

## 2021-07-16 DIAGNOSIS — J9601 Acute respiratory failure with hypoxia: Secondary | ICD-10-CM | POA: Diagnosis present

## 2021-07-16 LAB — GLUCOSE, CAPILLARY: Glucose-Capillary: 184 mg/dL — ABNORMAL HIGH (ref 70–99)

## 2021-07-16 DEATH — deceased

## 2021-07-18 LAB — CULTURE, BLOOD (ROUTINE X 2)
Culture: NO GROWTH
Culture: NO GROWTH
Special Requests: ADEQUATE
Special Requests: ADEQUATE

## 2021-08-16 NOTE — Progress Notes (Signed)
Patients safety sitter called out of the room for help. Safety sitter stated that patient was talking and then situated himself more comfortable in the bed, and urinated. Then had what he described as seizure-like activity (shaking) and then went limp/unresponsive. Upon arrival to room, patient unresponsive to verbal and physical stimuli with no pulse or respirations. Rapid response was called while charge nurse attempted to get vital signs and blood sugar. Unable to get oxygen reading, so placed patient on 15L NRB.  Hospitalist on call, Sharion Settler arrived and subsequently pronounced patient as deceased. Charge RN aware.  AC notified. [facility] called.  Patients daughter, Lurena Joiner notified with all questions answered and emotional support given.

## 2021-08-16 NOTE — Progress Notes (Signed)
Patients daughter requested that his wedding band be taken off and be locked up with security, also his red plaid blanket. Security came to pick up ring but informed us that they are not able to lock up clothing, so blanket was placed in patient belonging bag with patient label placed on bag and blanket. Then placed at nurses station to be picked up by family this morning.

## 2021-08-16 NOTE — Death Summary Note (Addendum)
DEATH SUMMARY   Patient Details  Name: Steve Lloyd MRN: [id number] DOB: Feb 04, 1930  Admission/Discharge Information   Admit Date:  07-21-21  Date of Death: Date of Death: 07-25-21  Time of Death: Time of Death: 0224  Length of Stay: 4  Referring Physician: Everardo Pacific, MD   Reason(s) for Hospitalization  Worsening confusion  Diagnoses  Preliminary cause of death:  Secondary Diagnoses (including complications and co-morbidities):  Principal Problem:   CAP (community acquired pneumonia) Active Problems:   Permanent atrial fibrillation ([place])   CAD s/p CABG [place] to LAD Jan 08, 1997   Hypercholesterolemia   Essential hypertension   Type II diabetes mellitus with renal manifestations ([place])   Atrial fibrillation, chronic (HCC)   Dementia ([place])   CKD (chronic kidney disease), stage IIIa   Hypokalemia   Elevated troponin   Fall at home, initial encounter   Acute metabolic encephalopathy   Normocytic anemia   Acute HFrEF (heart failure with reduced ejection fraction) ([place])   Acute hypoxemic respiratory failure [facility])   Brief Hospital Course (including significant findings, care, treatment, and services provided and events leading to death)  Steve Lloyd is a 87 y.o. year old male with medical history significant of advanced dementia, hypertension, hyperlipidemia, diabetes mellitus, pacemaker placement, CAD, CABG, atrial fibrillation on Eliquis, kidney stone, CKD stage IIIa, who presented to the hospital with fall, cough and change in mental status/confusion.  He did not have any chest pain or shortness of breath but his troponins were elevated (maximum troponin level was 764).   He was admitted to the hospital for bilateral pneumonia complicated by acute hypoxemic respiratory failure and acute metabolic encephalopathy.  Sepsis was ruled out.  He was treated with empiric IV antibiotics.  Blood culture revealed Enterococcus faecalis, staph hemolyticus and Staph  epidermidis.  ID was consulted to assist with management.  Staph hemolyticus and staph epidermidis were thought to be contaminants.  He was seen in consultation by the cardiologist for elevated troponins.  2D echo revealed EF estimated at 25 to 30%, severely dilated right and left atria, moderate to severe tricuspid regurgitation and LV diastolic parameters were indeterminate.  Per cardiologist, he was not a good candidate for ischemia evaluation/invasive work-up because of severe dementia.   His condition deteriorated and he expired on 07-25-21 at 2:24 AM.     Pertinent Labs and Studies  Significant Diagnostic Studies CT HEAD WO CONTRAST (5MM)  Result Date: July 21, 2021 CLINICAL DATA:  Headache, intracranial hemorrhage suspected. Head trauma, minor (Age >= 65y). Mental status change, unknown cause; Neck trauma (Age >= 65y). Multiple falls. EXAM: CT HEAD WITHOUT CONTRAST CT CERVICAL SPINE WITHOUT CONTRAST TECHNIQUE: Multidetector CT imaging of the head and cervical spine was performed following the standard protocol without intravenous contrast. Multiplanar CT image reconstructions of the cervical spine were also generated. COMPARISON:  04/21/2019 head CT report from [facility] FINDINGS: CT HEAD FINDINGS Brain: There is no evidence of an acute infarct, acute intracranial hemorrhage, mass, or midline shift. There are small low-density subdural fluid collections over both frontal convexities measuring up to 1 cm in thickness which were also described described in the 01-09-2019 CT report favoring chronic hygromas. These result in slight flattening of frontal gyri without other more significant mass effect. There is mild-to-moderate cerebral atrophy. Hypodensities in the cerebral white matter bilaterally are nonspecific but compatible with mild chronic small vessel ischemic disease. Vascular: Calcified atherosclerosis at the skull base. No hyperdense vessel. Skull: No acute fracture or suspicious osseous lesion.  Sinuses/Orbits:  Chronic left maxillary sinusitis with complete opacification of the sinus by partially calcified material and prominent osteitis. Clear mastoid air cells. Bilateral cataract extraction. Other: None. CT CERVICAL SPINE FINDINGS Alignment: Trace retrolisthesis of C5 on C6 and trace anterolisthesis of C6 on C7, C7 on T1, and T1 on T2. Skull base and vertebrae: No acute fracture or suspicious osseous lesion. Bulky anterior vertebral ossification from C3-C7 with solid interbody at C5-6 and C6-7. Soft tissues and spinal canal: No prevertebral fluid or swelling. No visible canal hematoma. Disc levels: Multilevel facet arthrosis, most severe on the right at C7-T1. Moderate multilevel neural foraminal stenosis due to uncovertebral and facet spurring. Mild multilevel spinal stenosis. Upper chest: Mild scarring in the lung apices. Other: Moderate calcific atherosclerosis at the carotid bifurcations. Partially visualized pacemaker. IMPRESSION: 1. No evidence of acute intracranial abnormality. 2. Small chronic bilateral subdural hygromas. 3. Mild chronic small vessel ischemic disease and cerebral atrophy. 4. No acute cervical spine fracture. Electronically Signed   By: Logan Bores M.D.   On: 06/25/2021 07:04   CT Cervical Spine Wo Contrast  Result Date: 06/26/2021 CLINICAL DATA:  Headache, intracranial hemorrhage suspected. Head trauma, minor (Age >= 65y). Mental status change, unknown cause; Neck trauma (Age >= 65y). Multiple falls. EXAM: CT HEAD WITHOUT CONTRAST CT CERVICAL SPINE WITHOUT CONTRAST TECHNIQUE: Multidetector CT imaging of the head and cervical spine was performed following the standard protocol without intravenous contrast. Multiplanar CT image reconstructions of the cervical spine were also generated. COMPARISON:  04/21/2019 head CT report from [facility] FINDINGS: CT HEAD FINDINGS Brain: There is no evidence of an acute infarct, acute intracranial hemorrhage, mass, or midline shift. There are small  low-density subdural fluid collections over both frontal convexities measuring up to 1 cm in thickness which were also described described in the 2020 CT report favoring chronic hygromas. These result in slight flattening of frontal gyri without other more significant mass effect. There is mild-to-moderate cerebral atrophy. Hypodensities in the cerebral white matter bilaterally are nonspecific but compatible with mild chronic small vessel ischemic disease. Vascular: Calcified atherosclerosis at the skull base. No hyperdense vessel. Skull: No acute fracture or suspicious osseous lesion. Sinuses/Orbits: Chronic left maxillary sinusitis with complete opacification of the sinus by partially calcified material and prominent osteitis. Clear mastoid air cells. Bilateral cataract extraction. Other: None. CT CERVICAL SPINE FINDINGS Alignment: Trace retrolisthesis of C5 on C6 and trace anterolisthesis of C6 on C7, C7 on T1, and T1 on T2. Skull base and vertebrae: No acute fracture or suspicious osseous lesion. Bulky anterior vertebral ossification from C3-C7 with solid interbody at C5-6 and C6-7. Soft tissues and spinal canal: No prevertebral fluid or swelling. No visible canal hematoma. Disc levels: Multilevel facet arthrosis, most severe on the right at C7-T1. Moderate multilevel neural foraminal stenosis due to uncovertebral and facet spurring. Mild multilevel spinal stenosis. Upper chest: Mild scarring in the lung apices. Other: Moderate calcific atherosclerosis at the carotid bifurcations. Partially visualized pacemaker. IMPRESSION: 1. No evidence of acute intracranial abnormality. 2. Small chronic bilateral subdural hygromas. 3. Mild chronic small vessel ischemic disease and cerebral atrophy. 4. No acute cervical spine fracture. Electronically Signed   By: Logan Bores M.D.   On: 06/18/2021 07:04   DG Chest Port 1 View  Result Date: 07/13/2021 CLINICAL DATA:  87 year old male with history of dyspnea. EXAM: PORTABLE  CHEST 1 VIEW COMPARISON:  Chest x-ray 07/11/2021. FINDINGS: Lung volumes are low. Diffuse peribronchial cuffing, widespread interstitial prominence and patchy multifocal ill-defined airspace disease is noted  in the lungs bilaterally, most severe in the right lung, where there is significantly worsened airspace consolidation. No definite pleural effusions. No pneumothorax. Cardiomegaly with cephalization of the pulmonary vasculature. Upper mediastinal contours are within normal limits. Atherosclerotic calcifications in the thoracic aorta. Left-sided pacemaker device in place with lead tips projecting over the expected location of the right ventricular apex. Status post median sternotomy. IMPRESSION: 1. Worsening bilateral bronchopneumonia (right greater than left), as above. 2. Cardiomegaly with pulmonary venous congestion. 3. Aortic atherosclerosis. Electronically Signed   By: Vinnie Langton M.D.   On: 07/13/2021 07:48   DG Chest Port 1 View  Result Date: 07/11/2021 CLINICAL DATA:  Questionable sepsis.  Pending labs. EXAM: PORTABLE CHEST 1 VIEW COMPARISON:  06/21/2016 FINDINGS: Indistinct bilateral airspace disease greatest at the bases. No Kerley lines or pleural fluid. Chronic cardiomegaly. Prior median sternotomy and single chamber pacer implant from the left. IMPRESSION: Bilateral airspace disease with history suggesting pneumonia. Electronically Signed   By: Jorje Guild M.D.   On: 06/25/2021 06:49   ECHOCARDIOGRAM COMPLETE  Result Date: 07/13/2021    ECHOCARDIOGRAM REPORT   Patient Name:   Steve Lloyd Date of Exam: 06/23/2021 Medical Rec #:  [id number]        Height:       67.0 in Accession #:    [id number]       Weight:       147.7 lb Date of Birth:  13-Apr-1930         BSA:          1.778 m Patient Age:    53 years         BP:           125/77 mmHg Patient Gender: M                HR:           66 bpm. Exam Location:  [facility] Procedure: 2D Echo, Cardiac Doppler and Color Doppler Indications:      F63.84 Acute Diastolic CHF  History:         Patient has prior history of Echocardiogram examinations, most                  recent 06/16/2016. CAD, Pacemaker, Arrythmias:Atrial                  Fibrillation; Risk Factors:Diabetes.  Sonographer:     Cresenciano Lick RDCS Referring Phys:  [place] Diagnosing Phys: Kate Sable MD IMPRESSIONS  1. Left ventricular ejection fraction, by estimation, is 25 to 30%. The left ventricle has severely decreased function. The left ventricle demonstrates global hypokinesis. Left ventricular diastolic parameters are indeterminate.  2. Right ventricular systolic function is mildly reduced. The right ventricular size is normal. There is severely elevated pulmonary artery systolic pressure. The estimated right ventricular systolic pressure is 66.5 mmHg.  3. Left atrial size was severely dilated.  4. Right atrial size was severely dilated.  5. The mitral valve is normal in structure. Moderate mitral valve regurgitation.  6. Tricuspid valve regurgitation is moderate to severe.  7. The aortic valve is tricuspid. Aortic valve regurgitation is mild. Mild to moderate aortic valve sclerosis/calcification is present, without any evidence of aortic stenosis.  8. The inferior vena cava is dilated in size with <50% respiratory variability, suggesting right atrial pressure of 15 mmHg. FINDINGS  Left Ventricle: Left ventricular ejection fraction, by estimation, is 25 to 30%. The left ventricle has severely decreased function.  The left ventricle demonstrates global hypokinesis. The left ventricular internal cavity size was normal in size. There is no left ventricular hypertrophy. Left ventricular diastolic parameters are indeterminate. Right Ventricle: The right ventricular size is normal. No increase in right ventricular wall thickness. Right ventricular systolic function is mildly reduced. There is severely elevated pulmonary artery systolic pressure. The tricuspid regurgitant  velocity is 3.63 m/s, and with an assumed right atrial pressure of 15 mmHg, the estimated right ventricular systolic pressure is 49.4 mmHg. Left Atrium: Left atrial size was severely dilated. Right Atrium: Right atrial size was severely dilated. Pericardium: There is no evidence of pericardial effusion. Mitral Valve: The mitral valve is normal in structure. Moderate mitral valve regurgitation. Tricuspid Valve: The tricuspid valve is normal in structure. Tricuspid valve regurgitation is moderate to severe. Aortic Valve: The aortic valve is tricuspid. Aortic valve regurgitation is mild. Mild to moderate aortic valve sclerosis/calcification is present, without any evidence of aortic stenosis. Pulmonic Valve: The pulmonic valve was not well visualized. Pulmonic valve regurgitation is not visualized. Aorta: The aortic root is normal in size and structure. Venous: The inferior vena cava is dilated in size with less than 50% respiratory variability, suggesting right atrial pressure of 15 mmHg. IAS/Shunts: No atrial level shunt detected by color flow Doppler. Additional Comments: A device lead is visualized.  LEFT VENTRICLE PLAX 2D LVIDd:         5.50 cm LVIDs:         4.20 cm LV PW:         1.10 cm LV IVS:        0.80 cm LVOT diam:     1.80 cm LV SV:         32 LV SV Index:   18 LVOT Area:     2.54 cm  RIGHT VENTRICLE             IVC RV Basal diam:  5.00 cm     IVC diam: 2.60 cm RV S prime:     15.05 cm/s TAPSE (M-mode): 1.5 cm LEFT ATRIUM              Index       RIGHT ATRIUM           Index LA diam:        5.60 cm  3.15 cm/m  RA Area:     32.10 cm LA Vol (A2C):   101.0 ml 56.81 ml/m RA Volume:   118.00 ml 66.37 ml/m LA Vol (A4C):   89.1 ml  50.12 ml/m LA Biplane Vol: 94.8 ml  53.32 ml/m  AORTIC VALVE LVOT Vmax:   68.75 cm/s LVOT Vmean:  50.900 cm/s LVOT VTI:    0.128 m  AORTA Ao Root diam: 3.50 cm MV E velocity: 85.17 cm/s  TRICUSPID VALVE                            TR Peak grad:   52.7 mmHg                             TR Vmax:        363.00 cm/s                             SHUNTS  Systemic VTI:  0.13 m                            Systemic Diam: 1.80 cm Kate Sable MD Electronically signed by Kate Sable MD Signature Date/Time: 07/13/2021/1:06:02 PM    Final     Microbiology Recent Results (from the past 240 hour(s))  Blood culture (routine single)     Status: Abnormal   Collection Time: 06/19/2021  6:12 AM   Specimen: BLOOD  Result Value Ref Range Status   Specimen Description   Final    BLOOD  LAC Performed at [facility], [address] [place] [address], [place], [place] [address]    Special Requests   Final    BOTTLES DRAWN AEROBIC AND ANAEROBIC Blood Culture adequate volume Performed at [facility], [address], [place], [place] [address]    Culture  Setup Time   Final    GRAM POSITIVE COCCI Organism ID to follow CRITICAL RESULT CALLED TO, READ BACK BY AND VERIFIED WITH: JASON ROBBINS PHARMD 2139 07/15/2021 HNM IN BOTH AEROBIC AND ANAEROBIC BOTTLES Performed at [facility], [address], [place], [place] [address]    Culture (A)  Final    ENTEROCOCCUS FAECALIS STAPHYLOCOCCUS EPIDERMIDIS STAPHYLOCOCCUS HAEMOLYTICUS THE SIGNIFICANCE OF ISOLATING THIS ORGANISM FROM A SINGLE SET OF BLOOD CULTURES WHEN MULTIPLE SETS ARE DRAWN IS UNCERTAIN. PLEASE NOTIFY THE MICROBIOLOGY DEPARTMENT WITHIN ONE WEEK IF SPECIATION AND SENSITIVITIES ARE REQUIRED. Performed at [facility], [place] [address], [place], [place] [address]    Report Status 07/15/2021 FINAL  Final   Organism ID, Bacteria ENTEROCOCCUS FAECALIS  Final      Susceptibility   Enterococcus faecalis - MIC*    AMPICILLIN <=2 SENSITIVE Sensitive     VANCOMYCIN <=0.5 SENSITIVE Sensitive     GENTAMICIN SYNERGY SENSITIVE Sensitive     * ENTEROCOCCUS FAECALIS  Blood Culture ID Panel (Reflexed)     Status: Abnormal   Collection Time: 07/14/2021  6:12 AM  Result Value Ref Range Status    Enterococcus faecalis DETECTED (A) NOT DETECTED Final    Comment: CRITICAL RESULT CALLED TO, READ BACK BY AND VERIFIED WITH: JASON ROBBINS PHARMD 2139 07/04/2021 HNM    Enterococcus Faecium NOT DETECTED NOT DETECTED Final   Listeria monocytogenes NOT DETECTED NOT DETECTED Final   Staphylococcus species DETECTED (A) NOT DETECTED Final    Comment: CRITICAL RESULT CALLED TO, READ BACK BY AND VERIFIED WITH: JASON ROBBINS PHARMD 2139 06/23/2021 HNM    Staphylococcus aureus (BCID) NOT DETECTED NOT DETECTED Final   Staphylococcus epidermidis DETECTED (A) NOT DETECTED Final    Comment: Methicillin (oxacillin) resistant coagulase negative staphylococcus. Possible blood culture contaminant (unless isolated from more than one blood culture draw or clinical case suggests pathogenicity). No antibiotic treatment is indicated for blood  culture contaminants. CRITICAL RESULT CALLED TO, READ BACK BY AND VERIFIED WITH: JASON ROBBINS PHARMD 2139 06/26/2021 HNM    Staphylococcus lugdunensis NOT DETECTED NOT DETECTED Final   Streptococcus species NOT DETECTED NOT DETECTED Final   Streptococcus agalactiae NOT DETECTED NOT DETECTED Final   Streptococcus pneumoniae NOT DETECTED NOT DETECTED Final   Streptococcus pyogenes NOT DETECTED NOT DETECTED Final   A.calcoaceticus-baumannii NOT DETECTED NOT DETECTED Final   Bacteroides fragilis NOT DETECTED NOT DETECTED Final   Enterobacterales NOT DETECTED NOT DETECTED Final   Enterobacter cloacae complex NOT DETECTED NOT DETECTED Final   Escherichia coli NOT DETECTED NOT DETECTED Final   Klebsiella aerogenes  NOT DETECTED NOT DETECTED Final   Klebsiella oxytoca NOT DETECTED NOT DETECTED Final   Klebsiella pneumoniae NOT DETECTED NOT DETECTED Final   Proteus species NOT DETECTED NOT DETECTED Final   Salmonella species NOT DETECTED NOT DETECTED Final   Serratia marcescens NOT DETECTED NOT DETECTED Final   Haemophilus influenzae NOT DETECTED NOT DETECTED Final   Neisseria  meningitidis NOT DETECTED NOT DETECTED Final   Pseudomonas aeruginosa NOT DETECTED NOT DETECTED Final   Stenotrophomonas maltophilia NOT DETECTED NOT DETECTED Final   Candida albicans NOT DETECTED NOT DETECTED Final   Candida auris NOT DETECTED NOT DETECTED Final   Candida glabrata NOT DETECTED NOT DETECTED Final   Candida krusei NOT DETECTED NOT DETECTED Final   Candida parapsilosis NOT DETECTED NOT DETECTED Final   Candida tropicalis NOT DETECTED NOT DETECTED Final   Cryptococcus neoformans/gattii NOT DETECTED NOT DETECTED Final   Methicillin resistance mecA/C DETECTED (A) NOT DETECTED Final    Comment: CRITICAL RESULT CALLED TO, READ BACK BY AND VERIFIED WITH: JASON ROBBINS PHARMD 2139 06/22/2021 HNM    Vancomycin resistance NOT DETECTED NOT DETECTED Final    Comment: Performed at [facility], [address] [place], [place] [address]  Resp Panel by RT-PCR (Flu A&B, Covid) Nasopharyngeal Swab     Status: None   Collection Time: 07/11/2021  6:16 AM   Specimen: Nasopharyngeal Swab; Nasopharyngeal(NP) swabs in vial transport medium  Result Value Ref Range Status   SARS Coronavirus 2 by RT PCR NEGATIVE NEGATIVE Final    Comment: (NOTE) SARS-CoV-2 target nucleic acids are NOT DETECTED.  The SARS-CoV-2 RNA is generally detectable in upper respiratory specimens during the acute phase of infection. The lowest concentration of SARS-CoV-2 viral copies this assay can detect is 138 copies/mL. A negative result does not preclude SARS-Cov-2 infection and should not be used as the sole basis for treatment or other patient management decisions. A negative result may occur with  improper specimen collection/handling, submission of specimen other than nasopharyngeal swab, presence of viral mutation(s) within the areas targeted by this assay, and inadequate number of viral copies(<138 copies/mL). A negative result must be combined with clinical observations, patient history, and  epidemiological information. The expected result is Negative.  Fact Sheet for Patients:  EntrepreneurPulse.com.au  Fact Sheet for Healthcare Providers:  IncredibleEmployment.be  This test is no t yet approved or cleared by the [place] FDA and  has been authorized for detection and/or diagnosis of SARS-CoV-2 by FDA under an Emergency Use Authorization (EUA). This EUA will remain  in effect (meaning this test can be used) for the duration of the COVID-19 declaration under Section 564(b)(1) of the Act, 21 U.S.C.section 360bbb-3(b)(1), unless the authorization is terminated  or revoked sooner.       Influenza A by PCR NEGATIVE NEGATIVE Final   Influenza B by PCR NEGATIVE NEGATIVE Final    Comment: (NOTE) The Xpert Xpress SARS-CoV-2/FLU/RSV plus assay is intended as an aid in the diagnosis of influenza from Nasopharyngeal swab specimens and should not be used as a sole basis for treatment. Nasal washings and aspirates are unacceptable for Xpert Xpress SARS-CoV-2/FLU/RSV testing.  Fact Sheet for Patients: EntrepreneurPulse.com.au  Fact Sheet for Healthcare Providers: IncredibleEmployment.be  This test is not yet approved or cleared by the [place] FDA and has been authorized for detection and/or diagnosis of SARS-CoV-2 by FDA under an Emergency Use Authorization (EUA). This EUA will remain in effect (meaning this test can be used) for the duration of the COVID-19 declaration  under Section 564(b)(1) of the Act, 21 U.S.C. section 360bbb-3(b)(1), unless the authorization is terminated or revoked.  Performed at [facility], [address], [place], [place] [address]   Urine Culture     Status: None   Collection Time: 07/07/2021 12:00 PM   Specimen: Urine, Random  Result Value Ref Range Status   Specimen Description   Final    URINE, RANDOM Performed at [facility], [address], [place], [place] [address]    Special Requests   Final    NONE Performed at [facility], [address] [place], [place] [address]    Culture   Final    NO GROWTH Performed at [facility], [place] [address] [place], [place] [address]    Report Status 07/13/2021 FINAL  Final  CULTURE, BLOOD (ROUTINE X 2) w Reflex to ID Panel     Status: None (Preliminary result)   Collection Time: 07/13/21 12:27 PM   Specimen: BLOOD  Result Value Ref Range Status   Specimen Description BLOOD LEFT ANTECUBITAL  Final   Special Requests   Final    BOTTLES DRAWN AEROBIC AND ANAEROBIC Blood Culture adequate volume   Culture   Final    NO GROWTH 3 DAYS Performed at [facility], [address] [place], [place] [address]    Report Status PENDING  Incomplete  CULTURE, BLOOD (ROUTINE X 2) w Reflex to ID Panel     Status: None (Preliminary result)   Collection Time: 07/13/21 12:30 PM   Specimen: BLOOD  Result Value Ref Range Status   Specimen Description BLOOD BLOOD LEFT HAND  Final   Special Requests   Final    BOTTLES DRAWN AEROBIC AND ANAEROBIC Blood Culture adequate volume   Culture   Final    NO GROWTH 3 DAYS Performed at [facility], [address] [place], [place] [address]    Report Status PENDING  Incomplete  C Difficile Quick Screen w PCR reflex     Status: None   Collection Time: 07/14/21  5:34 PM   Specimen: STOOL  Result Value Ref Range Status   C Diff antigen NEGATIVE NEGATIVE Final   C Diff toxin NEGATIVE NEGATIVE Final   C Diff interpretation No C. difficile detected.  Final    Comment: Performed at [facility], [address] [place],  [address]  Gastrointestinal Panel by PCR , Stool     Status: None   Collection Time: 07/14/21  5:34 PM   Specimen: STOOL  Result Value Ref Range Status   Campylobacter species NOT DETECTED NOT DETECTED Final   Plesimonas shigelloides NOT DETECTED NOT DETECTED Final   Salmonella species  NOT DETECTED NOT DETECTED Final   Yersinia enterocolitica NOT DETECTED NOT DETECTED Final   Vibrio species NOT DETECTED NOT DETECTED Final   Vibrio cholerae NOT DETECTED NOT DETECTED Final   Enteroaggregative E coli (EAEC) NOT DETECTED NOT DETECTED Final   Enteropathogenic E coli (EPEC) NOT DETECTED NOT DETECTED Final   Enterotoxigenic E coli (ETEC) NOT DETECTED NOT DETECTED Final   Shiga like toxin producing E coli (STEC) NOT DETECTED NOT DETECTED Final   Shigella/Enteroinvasive E coli (EIEC) NOT DETECTED NOT DETECTED Final   Cryptosporidium NOT DETECTED NOT DETECTED Final   Cyclospora cayetanensis NOT DETECTED NOT DETECTED Final   Entamoeba histolytica NOT DETECTED NOT DETECTED Final   Giardia lamblia NOT DETECTED NOT DETECTED Final   Adenovirus F40/41 NOT DETECTED NOT DETECTED Final  Astrovirus NOT DETECTED NOT DETECTED Final   Norovirus GI/GII NOT DETECTED NOT DETECTED Final   Rotavirus A NOT DETECTED NOT DETECTED Final   Sapovirus (I, II, IV, and V) NOT DETECTED NOT DETECTED Final    Comment: Performed at [facility], [address] [place], [place] [address]    Lab Basic Metabolic Panel: Recent Labs  Lab 06/25/2021 [phone] 06/22/2021 0832 07/13/21 0616 07/14/21 0615  NA 135  --  137  --   K 3.4*  --  3.7  --   CL 101  --  103  --   CO2 21*  --  22  --   GLUCOSE 139*  --  155*  --   BUN 31*  --  30*  --   CREATININE 1.26*  --  1.35* 1.47*  CALCIUM 8.5*  --  8.7*  --   MG  --  2.0  --   --    Liver Function Tests: Recent Labs  Lab 07/15/2021 0616  AST 30  ALT 22  ALKPHOS 57  BILITOT 1.2  PROT 6.1*  ALBUMIN 2.9*   Recent Labs  Lab 07/04/2021 0616  LIPASE 38   No results for input(s): AMMONIA in the last 168 hours. CBC: Recent Labs  Lab 06/25/2021 0616 06/19/2021 0631 07/13/21 0616  WBC 8.5  --  12.1*  NEUTROABS 6.5  --   --   HGB 9.1* 8.8* 9.3*  HCT 26.8* 26.4* 28.6*  MCV 94.7  --  95.7  PLT 176  --  198   Cardiac Enzymes: No results for  input(s): CKTOTAL, CKMB, CKMBINDEX, TROPONINI in the last 168 hours. Sepsis Labs: Recent Labs  Lab 06/30/2021 0612 06/30/2021 0616 07/04/2021 0837 07/13/21 0616 07/14/21 0615  PROCALCITON  --  0.11  --   --  0.27  WBC  --  8.5  --  12.1*  --   LATICACIDVEN 1.8  --  0.9  --   --     Procedures/Operations  None   Arianie Couse 08/07/21, 4:25 PM

## 2021-08-16 NOTE — Progress Notes (Signed)
Cross Cover Mr Steve Lloyd is 87 year old white male admitted with CAP , sepsis, elevated  troponin, and acute encephalopathy. He had underlying CAD, HTN, hypercholesterolemia, T2DM, CKD, dementiaatrial fib, and anemia.   Further evaluation while hospitalized found patient to have e faecalix and staph epi bacteremia and acute systolic heart failure, likely worsening ischemic cardiac disease of which he patient could not further be evaluated for secondary current physical status and comorbidities.   Patient was noted to have "shaking" activity by nursing staff  who assessed patient to have no pulse , spontaneous respirations , or neuro response at 0224 July 22, 2021. Death exam confirmed by me [id number] 07-22-2021,   Spoke with Lurena Joiner, his daughter, over phone and informed of patient death

## 2021-08-16 NOTE — Progress Notes (Signed)
   July 29, 2021 0220  Clinical Encounter Type  Visited With Other (Comment)  Visit Type Death  Referral From Other (Comment) (rapid response)  Spiritual Encounters  Spiritual Needs Other (Comment) (staff support)  Chaplain Burris responded to a rapid response page. Upon arrival at the unit, Pt appeared to be deceased. Chaplain provided a non-anxious presence and offered staff support. Chaplain remained available on unit while family was notified to provide ongoing support as needed.

## 2021-08-16 DEATH — deceased

## 2022-01-12 IMAGING — CT CT CERVICAL SPINE W/O CM
3 of 4 series · 9 of 33 positions shown, 11 images · non-contrast
Comparison: 04/21/2019 head CT report from [HOSPITAL]

CLINICAL DATA: Headache, intracranial hemorrhage suspected. Head
trauma, minor (Age >= 65y). Mental status change, unknown cause;
Neck trauma (Age >= 65y). Multiple falls.

EXAM:
CT HEAD WITHOUT CONTRAST
CT CERVICAL SPINE WITHOUT CONTRAST
TECHNIQUE: Multidetector CT imaging of the head and cervical spine was
performed following the standard protocol without intravenous
contrast. Multiplanar CT image reconstructions of the cervical spine
were also generated.

[Series 6: sagittal bone · sagittal · 0.27mm/px · 5 of 53 slices shown, 6 images]
[im 18/53  bone]
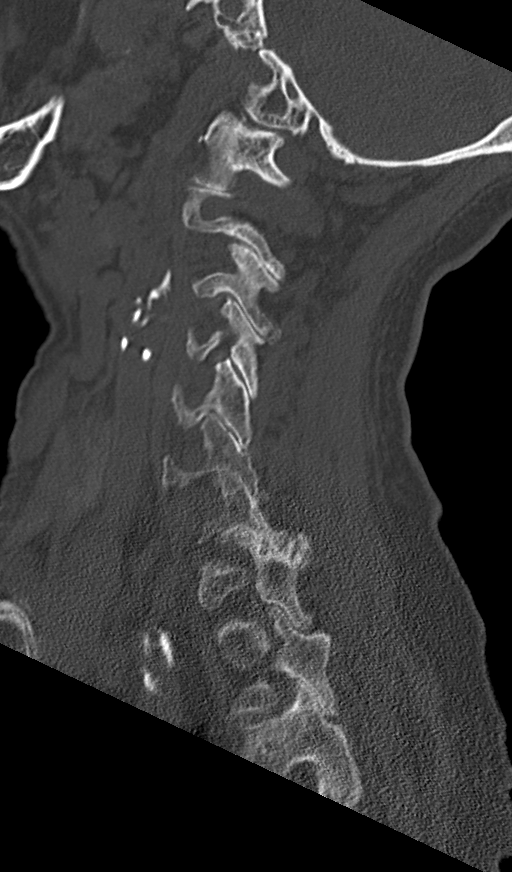
[im 22/53  bone]
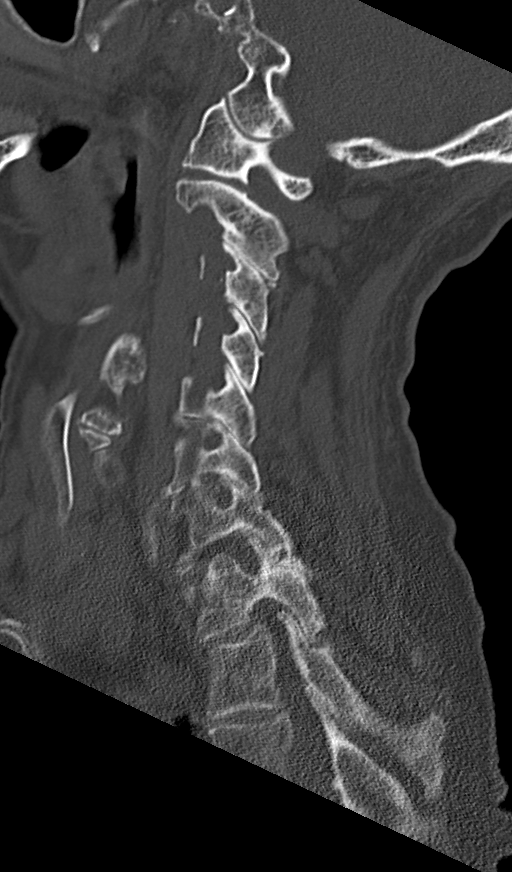
[im 27/53  soft-tissue]
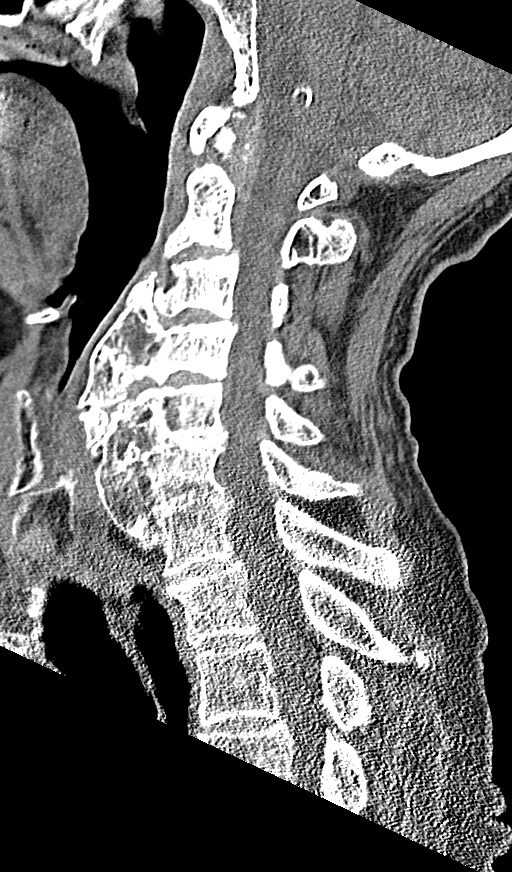
[im 27/53  bone]
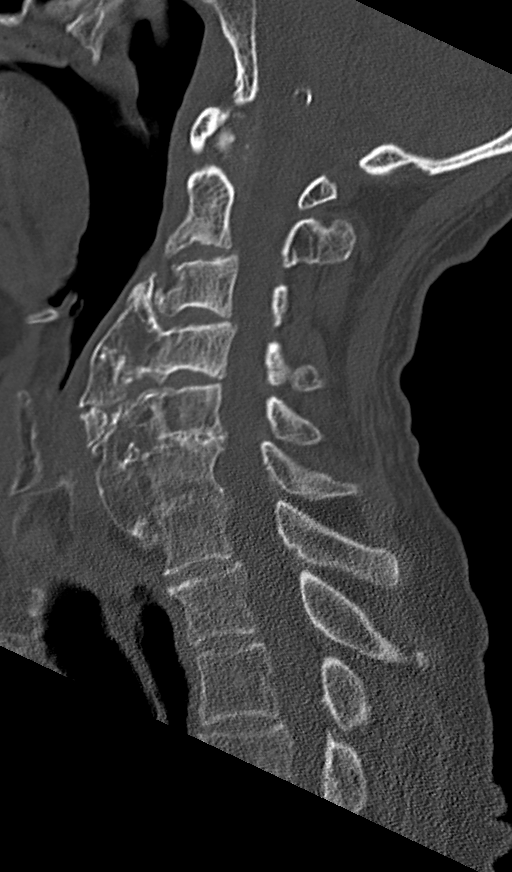
[im 31/53  bone]
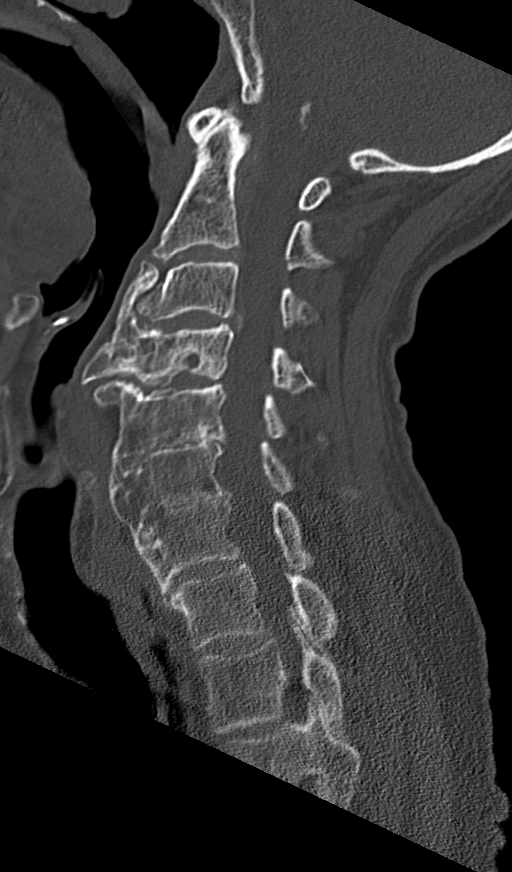
[im 35/53  bone]
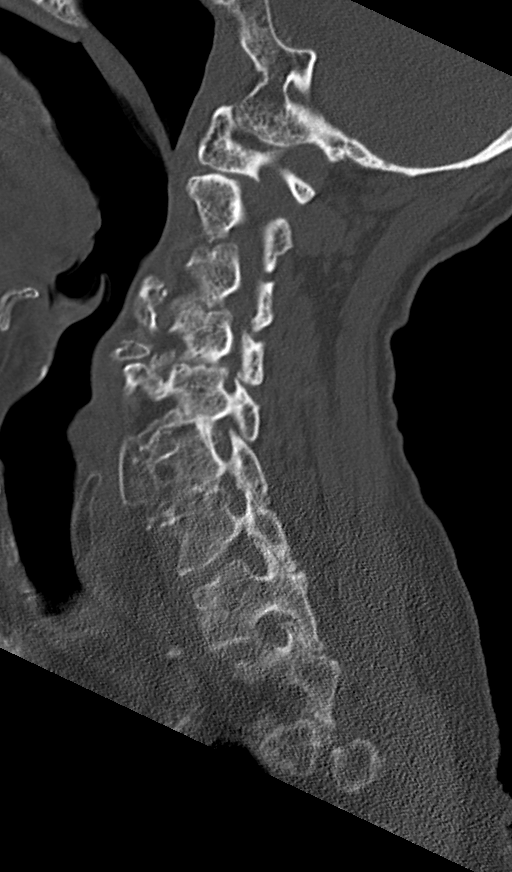

[Series 7: coronal bone · coronal · 0.21mm/px · 3 of 62 slices shown]
[im 14/62  bone]
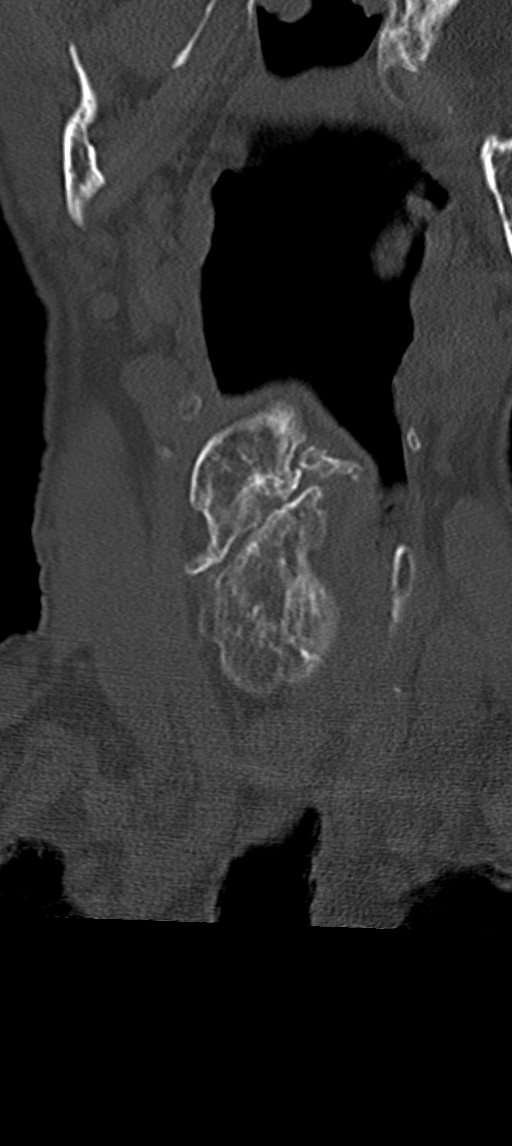
[im 25/62  bone]
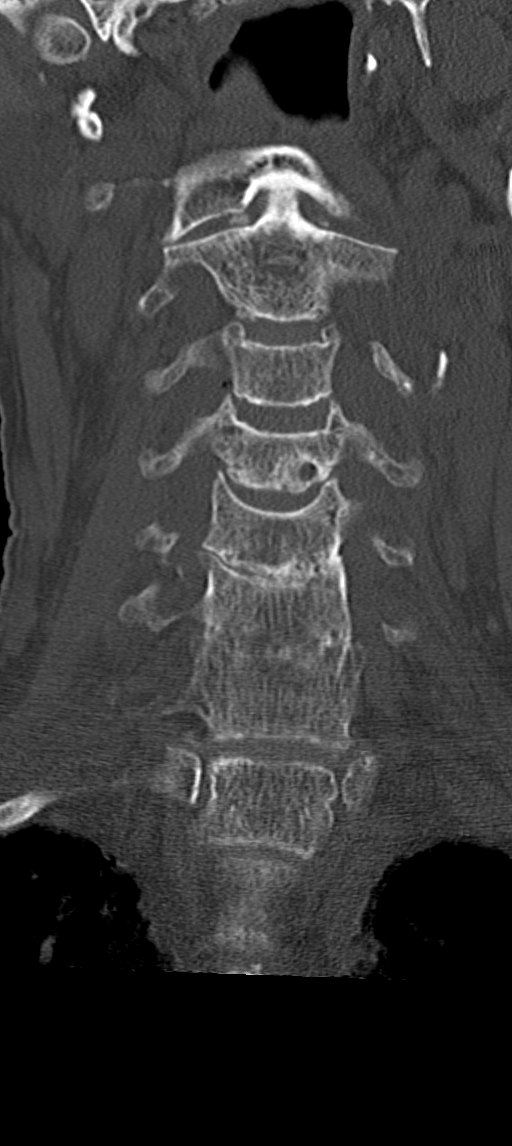
[im 37/62  bone]
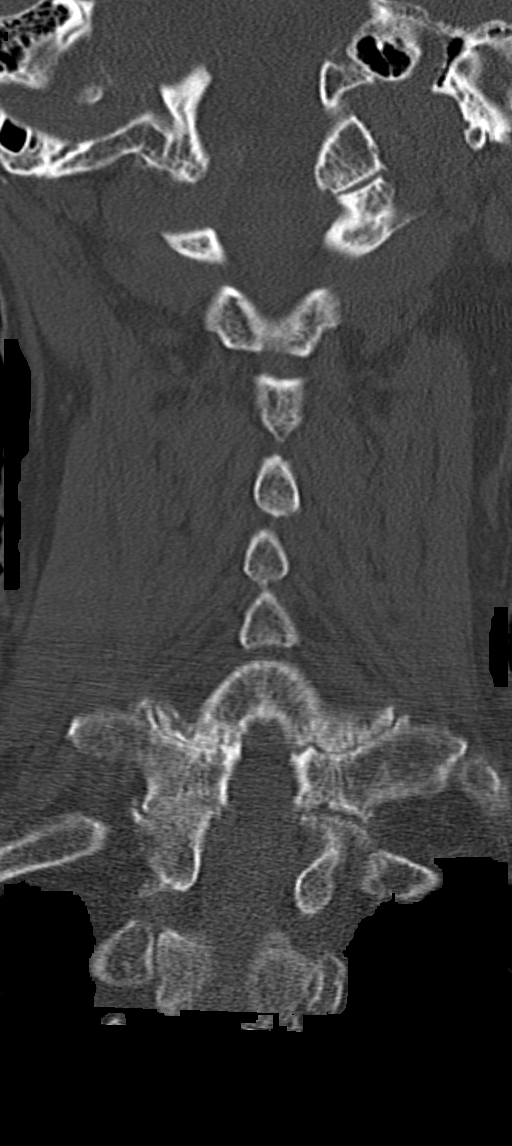

[Series 8: orthogonal bone · axial · 0.23mm/px · z∈[-225,-225]mm · 1 of 118 slices shown, 2 images]
[im 67/118  soft-tissue]
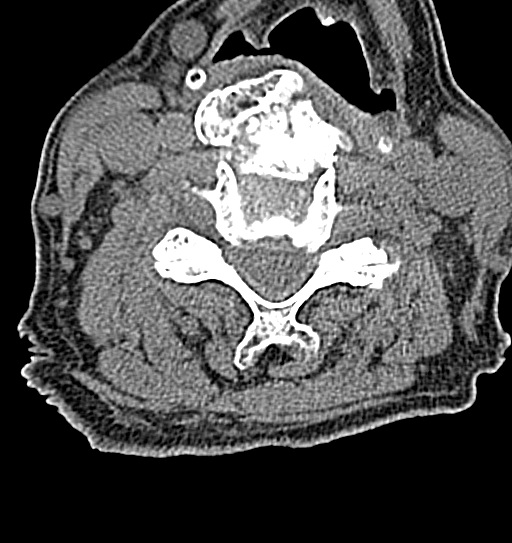
[im 67/118  bone]
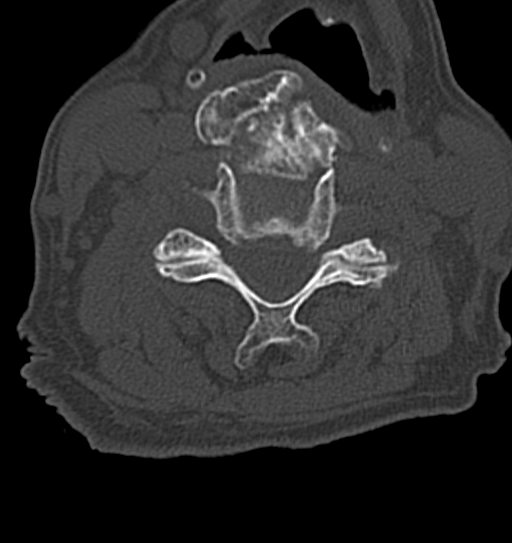

[9 of 33 positions shown; findings below may reference images not displayed]

FINDINGS: CT HEAD FINDINGS

Brain: There is no evidence of an acute infarct, acute intracranial
hemorrhage, mass, or midline shift. There are small low-density
subdural fluid collections over both frontal convexities measuring
up to 1 cm in thickness which were also described described in the
8686 CT report favoring chronic hygromas. These result in slight
flattening of frontal gyri without other more significant mass
effect. There is mild-to-moderate cerebral atrophy. Hypodensities in
the cerebral white matter bilaterally are nonspecific but compatible
with mild chronic small vessel ischemic disease.

Vascular: Calcified atherosclerosis at the skull base. No hyperdense
vessel.

Skull: No acute fracture or suspicious osseous lesion.

Sinuses/Orbits: Chronic left maxillary sinusitis with complete
opacification of the sinus by partially calcified material and
prominent osteitis. Clear mastoid air cells. Bilateral cataract
extraction.

Other: None.

CT CERVICAL SPINE FINDINGS

Alignment: Trace retrolisthesis of C5 on C6 and trace
anterolisthesis of C6 on C7, C7 on T1, and T1 on T2.

Skull base and vertebrae: No acute fracture or suspicious osseous
lesion. Bulky anterior vertebral ossification from C3-C7 with solid
interbody at C5-6 and C6-7.

Soft tissues and spinal canal: No prevertebral fluid or swelling. No
visible canal hematoma.

Disc levels: Multilevel facet arthrosis, most severe on the right at
C7-T1. Moderate multilevel neural foraminal stenosis due to
uncovertebral and facet spurring. Mild multilevel spinal stenosis.

Upper chest: Mild scarring in the lung apices.

Other: Moderate calcific atherosclerosis at the carotid
bifurcations. Partially visualized pacemaker.
IMPRESSION: 1. No evidence of acute intracranial abnormality.
2. Small chronic bilateral subdural hygromas.
3. Mild chronic small vessel ischemic disease and cerebral atrophy.
4. No acute cervical spine fracture.

## 2022-01-13 IMAGING — DX DG CHEST 1V PORT
1 series · 1 of 1 positions shown · non-contrast
Comparison: Chest x-ray 07/12/2021.

CLINICAL DATA: 89-year-old male with history of dyspnea.

EXAM:
PORTABLE CHEST 1 VIEW

[chest ap]
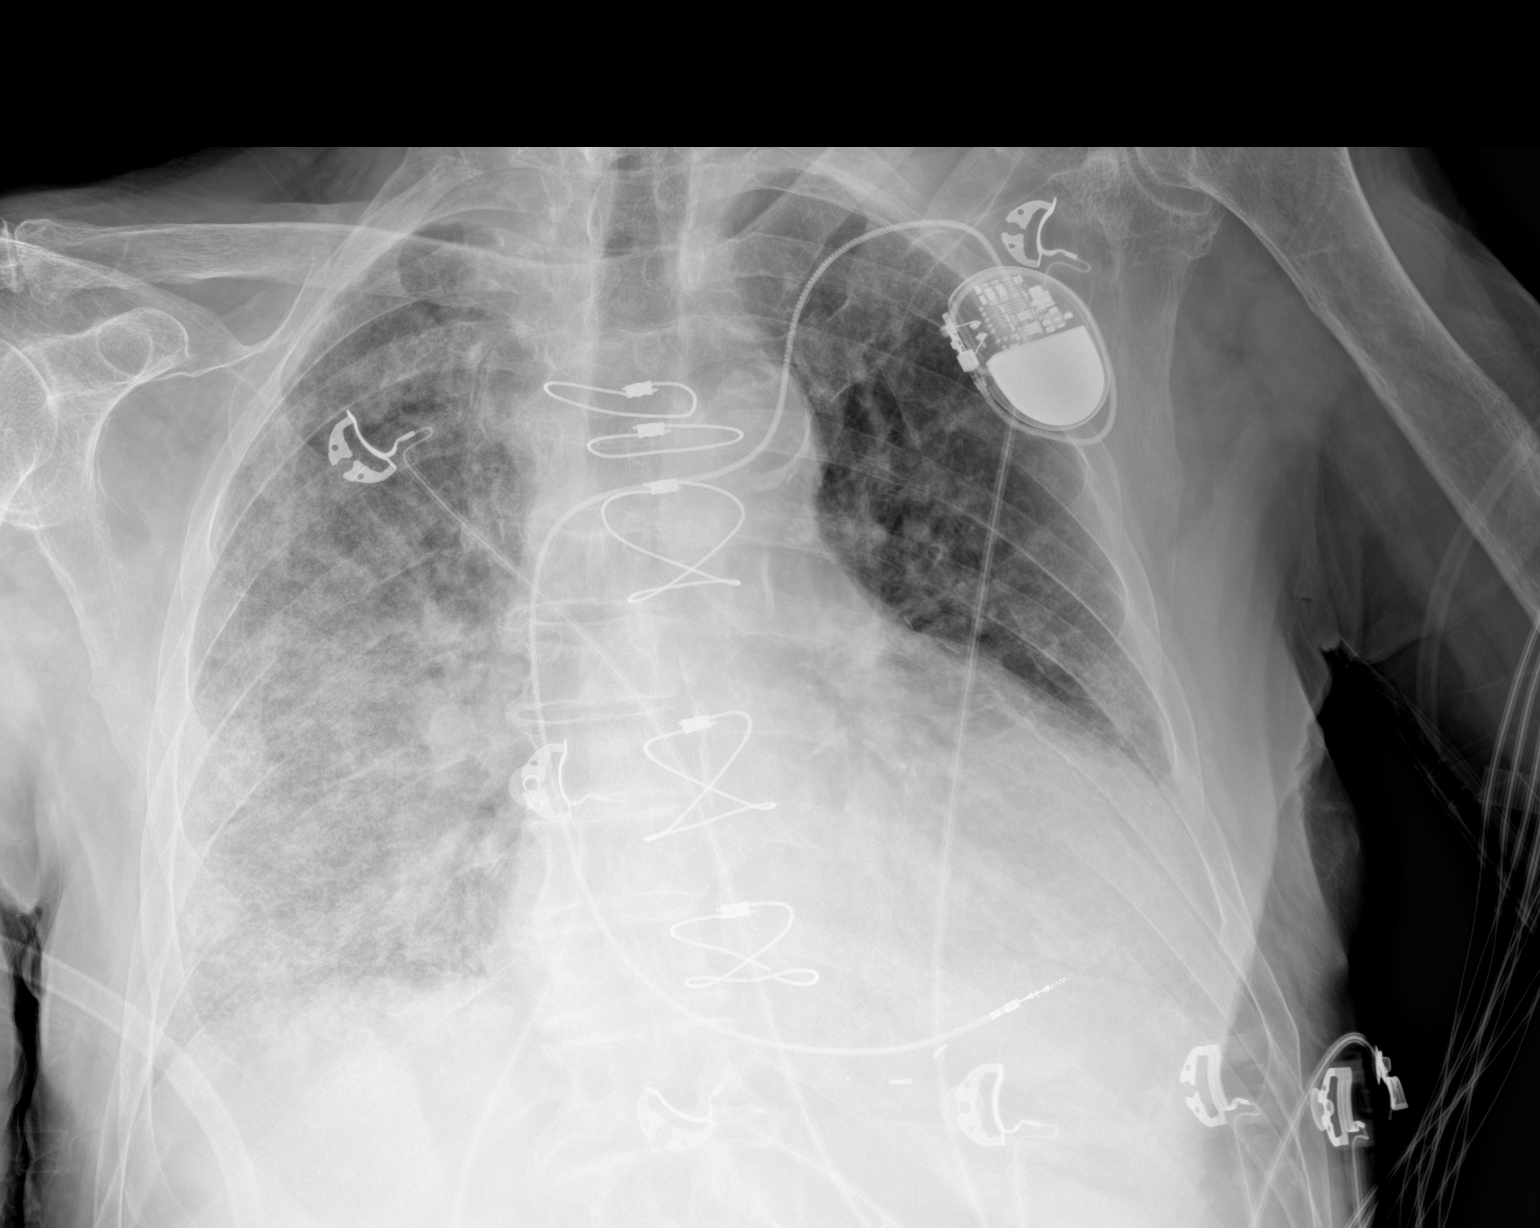

[1 of 1 positions shown; findings below may reference images not displayed]

FINDINGS: Lung volumes are low. Diffuse peribronchial cuffing, widespread
interstitial prominence and patchy multifocal ill-defined airspace
disease is noted in the lungs bilaterally, most severe in the right
lung, where there is significantly worsened airspace consolidation.
No definite pleural effusions. No pneumothorax. Cardiomegaly with
cephalization of the pulmonary vasculature. Upper mediastinal
contours are within normal limits. Atherosclerotic calcifications in
the thoracic aorta. Left-sided pacemaker device in place with lead
tips projecting over the expected location of the right ventricular
apex. Status post median sternotomy.
IMPRESSION: 1. Worsening bilateral bronchopneumonia (right greater than left),
as above.
2. Cardiomegaly with pulmonary venous congestion.
3. Aortic atherosclerosis.
